# Patient Record
Sex: Female | Born: 1935 | Race: White | Hispanic: No
Health system: Southern US, Community
[De-identification: ages and names within clinical notes are randomized; demographics above are authoritative.]

---

## 2003-07-21 ENCOUNTER — Other Ambulatory Visit: Payer: Self-pay

## 2004-05-08 ENCOUNTER — Encounter: Payer: Self-pay | Admitting: Internal Medicine

## 2004-06-08 ENCOUNTER — Encounter: Payer: Self-pay | Admitting: Internal Medicine

## 2004-07-08 ENCOUNTER — Encounter: Payer: Self-pay | Admitting: Internal Medicine

## 2004-08-08 ENCOUNTER — Encounter: Payer: Self-pay | Admitting: Internal Medicine

## 2004-09-08 ENCOUNTER — Encounter: Payer: Self-pay | Admitting: Internal Medicine

## 2004-10-06 ENCOUNTER — Encounter: Payer: Self-pay | Admitting: Internal Medicine

## 2004-11-06 ENCOUNTER — Encounter: Payer: Self-pay | Admitting: Internal Medicine

## 2004-12-06 ENCOUNTER — Encounter: Payer: Self-pay | Admitting: Internal Medicine

## 2005-01-06 ENCOUNTER — Encounter: Payer: Self-pay | Admitting: Internal Medicine

## 2005-02-05 ENCOUNTER — Encounter: Payer: Self-pay | Admitting: Internal Medicine

## 2005-03-04 ENCOUNTER — Ambulatory Visit: Payer: Self-pay | Admitting: Internal Medicine

## 2005-03-08 ENCOUNTER — Encounter: Payer: Self-pay | Admitting: Internal Medicine

## 2005-04-08 ENCOUNTER — Encounter: Payer: Self-pay | Admitting: Internal Medicine

## 2005-05-08 ENCOUNTER — Encounter: Payer: Self-pay | Admitting: Internal Medicine

## 2005-06-08 ENCOUNTER — Encounter: Payer: Self-pay | Admitting: Internal Medicine

## 2005-07-08 ENCOUNTER — Encounter: Payer: Self-pay | Admitting: Internal Medicine

## 2005-07-13 ENCOUNTER — Ambulatory Visit: Payer: Self-pay | Admitting: Internal Medicine

## 2005-08-08 ENCOUNTER — Encounter: Payer: Self-pay | Admitting: Internal Medicine

## 2005-09-08 ENCOUNTER — Encounter: Payer: Self-pay | Admitting: Internal Medicine

## 2005-10-06 ENCOUNTER — Encounter: Payer: Self-pay | Admitting: Internal Medicine

## 2005-11-06 ENCOUNTER — Encounter: Payer: Self-pay | Admitting: Internal Medicine

## 2005-12-06 ENCOUNTER — Encounter: Payer: Self-pay | Admitting: Internal Medicine

## 2006-01-06 ENCOUNTER — Encounter: Payer: Self-pay | Admitting: Internal Medicine

## 2006-02-05 ENCOUNTER — Encounter: Payer: Self-pay | Admitting: Internal Medicine

## 2006-03-08 ENCOUNTER — Encounter: Payer: Self-pay | Admitting: Internal Medicine

## 2006-04-04 ENCOUNTER — Ambulatory Visit: Payer: Self-pay | Admitting: Internal Medicine

## 2006-04-06 ENCOUNTER — Inpatient Hospital Stay (HOSPITAL_COMMUNITY): Admission: EM | Admit: 2006-04-06 | Discharge: 2006-04-11 | Payer: Self-pay | Admitting: Emergency Medicine

## 2006-04-07 ENCOUNTER — Encounter: Payer: Self-pay | Admitting: Cardiology

## 2006-04-07 ENCOUNTER — Ambulatory Visit: Payer: Self-pay | Admitting: Cardiology

## 2006-04-08 ENCOUNTER — Encounter: Payer: Self-pay | Admitting: Internal Medicine

## 2006-05-08 ENCOUNTER — Encounter: Payer: Self-pay | Admitting: Internal Medicine

## 2006-06-08 ENCOUNTER — Encounter: Payer: Self-pay | Admitting: Internal Medicine

## 2006-07-08 ENCOUNTER — Encounter: Payer: Self-pay | Admitting: Internal Medicine

## 2006-08-08 ENCOUNTER — Encounter: Payer: Self-pay | Admitting: Internal Medicine

## 2006-09-08 ENCOUNTER — Encounter: Payer: Self-pay | Admitting: Internal Medicine

## 2006-10-07 ENCOUNTER — Encounter: Payer: Self-pay | Admitting: Internal Medicine

## 2006-10-22 ENCOUNTER — Inpatient Hospital Stay (HOSPITAL_COMMUNITY): Admission: EM | Admit: 2006-10-22 | Discharge: 2006-10-25 | Payer: Self-pay | Admitting: Emergency Medicine

## 2006-10-23 ENCOUNTER — Encounter: Payer: Self-pay | Admitting: Vascular Surgery

## 2006-10-23 ENCOUNTER — Ambulatory Visit: Payer: Self-pay | Admitting: Vascular Surgery

## 2006-10-23 ENCOUNTER — Encounter (INDEPENDENT_AMBULATORY_CARE_PROVIDER_SITE_OTHER): Payer: Self-pay | Admitting: Interventional Cardiology

## 2006-11-07 ENCOUNTER — Encounter: Payer: Self-pay | Admitting: Internal Medicine

## 2006-12-07 ENCOUNTER — Encounter: Payer: Self-pay | Admitting: Internal Medicine

## 2006-12-21 ENCOUNTER — Ambulatory Visit: Payer: Self-pay | Admitting: Specialist

## 2006-12-29 ENCOUNTER — Ambulatory Visit: Payer: Self-pay | Admitting: Specialist

## 2007-01-07 ENCOUNTER — Encounter: Payer: Self-pay | Admitting: Internal Medicine

## 2007-02-06 ENCOUNTER — Encounter: Payer: Self-pay | Admitting: Internal Medicine

## 2007-03-09 ENCOUNTER — Encounter: Payer: Self-pay | Admitting: Internal Medicine

## 2007-04-06 ENCOUNTER — Ambulatory Visit: Payer: Self-pay | Admitting: Internal Medicine

## 2007-04-09 ENCOUNTER — Encounter: Payer: Self-pay | Admitting: Internal Medicine

## 2007-05-09 ENCOUNTER — Encounter: Payer: Self-pay | Admitting: Internal Medicine

## 2007-06-09 ENCOUNTER — Encounter: Payer: Self-pay | Admitting: Internal Medicine

## 2007-07-09 ENCOUNTER — Encounter: Payer: Self-pay | Admitting: Internal Medicine

## 2007-08-09 ENCOUNTER — Encounter: Payer: Self-pay | Admitting: Internal Medicine

## 2007-09-09 ENCOUNTER — Encounter: Payer: Self-pay | Admitting: Internal Medicine

## 2007-10-07 ENCOUNTER — Encounter: Payer: Self-pay | Admitting: Internal Medicine

## 2007-11-07 ENCOUNTER — Encounter: Payer: Self-pay | Admitting: Internal Medicine

## 2007-11-20 ENCOUNTER — Emergency Department (HOSPITAL_COMMUNITY): Admission: EM | Admit: 2007-11-20 | Discharge: 2007-11-20 | Payer: Self-pay | Admitting: Emergency Medicine

## 2007-12-07 ENCOUNTER — Encounter: Payer: Self-pay | Admitting: Internal Medicine

## 2008-01-07 ENCOUNTER — Encounter: Payer: Self-pay | Admitting: Internal Medicine

## 2008-02-06 ENCOUNTER — Encounter: Payer: Self-pay | Admitting: Internal Medicine

## 2008-03-08 ENCOUNTER — Encounter: Payer: Self-pay | Admitting: Internal Medicine

## 2008-04-08 ENCOUNTER — Encounter: Payer: Self-pay | Admitting: Internal Medicine

## 2008-05-08 ENCOUNTER — Encounter: Payer: Self-pay | Admitting: Internal Medicine

## 2008-06-08 ENCOUNTER — Encounter: Payer: Self-pay | Admitting: Internal Medicine

## 2008-07-08 ENCOUNTER — Encounter: Payer: Self-pay | Admitting: Internal Medicine

## 2008-08-08 ENCOUNTER — Encounter: Payer: Self-pay | Admitting: Internal Medicine

## 2008-09-08 ENCOUNTER — Encounter: Payer: Self-pay | Admitting: Internal Medicine

## 2008-10-06 ENCOUNTER — Encounter: Payer: Self-pay | Admitting: Internal Medicine

## 2008-11-06 ENCOUNTER — Encounter: Payer: Self-pay | Admitting: Internal Medicine

## 2008-12-06 ENCOUNTER — Encounter: Payer: Self-pay | Admitting: Internal Medicine

## 2009-01-06 ENCOUNTER — Encounter: Payer: Self-pay | Admitting: Internal Medicine

## 2009-02-05 ENCOUNTER — Encounter: Payer: Self-pay | Admitting: Internal Medicine

## 2009-02-22 ENCOUNTER — Emergency Department: Payer: Self-pay | Admitting: Emergency Medicine

## 2009-03-08 ENCOUNTER — Encounter: Payer: Self-pay | Admitting: Internal Medicine

## 2009-04-08 ENCOUNTER — Encounter: Payer: Self-pay | Admitting: Internal Medicine

## 2009-05-08 ENCOUNTER — Encounter: Payer: Self-pay | Admitting: Internal Medicine

## 2009-06-08 ENCOUNTER — Encounter: Payer: Self-pay | Admitting: Internal Medicine

## 2009-07-08 ENCOUNTER — Encounter: Payer: Self-pay | Admitting: Internal Medicine

## 2009-08-08 ENCOUNTER — Encounter: Payer: Self-pay | Admitting: Internal Medicine

## 2009-09-08 ENCOUNTER — Encounter: Payer: Self-pay | Admitting: Internal Medicine

## 2009-10-06 ENCOUNTER — Encounter: Payer: Self-pay | Admitting: Internal Medicine

## 2009-11-06 ENCOUNTER — Encounter: Payer: Self-pay | Admitting: Internal Medicine

## 2009-12-06 ENCOUNTER — Encounter: Payer: Self-pay | Admitting: Internal Medicine

## 2010-01-06 ENCOUNTER — Encounter: Payer: Self-pay | Admitting: Internal Medicine

## 2010-02-05 ENCOUNTER — Encounter: Payer: Self-pay | Admitting: Internal Medicine

## 2010-03-08 ENCOUNTER — Encounter: Payer: Self-pay | Admitting: Internal Medicine

## 2010-04-08 ENCOUNTER — Encounter: Payer: Self-pay | Admitting: Internal Medicine

## 2010-05-08 ENCOUNTER — Encounter: Payer: Self-pay | Admitting: Internal Medicine

## 2010-06-08 ENCOUNTER — Encounter: Payer: Self-pay | Admitting: Internal Medicine

## 2010-06-12 ENCOUNTER — Emergency Department: Payer: Self-pay | Admitting: Emergency Medicine

## 2010-07-08 ENCOUNTER — Encounter: Payer: Self-pay | Admitting: Internal Medicine

## 2010-08-08 ENCOUNTER — Encounter: Payer: Self-pay | Admitting: Internal Medicine

## 2010-09-08 ENCOUNTER — Encounter: Payer: Self-pay | Admitting: Internal Medicine

## 2010-09-28 ENCOUNTER — Ambulatory Visit: Payer: Self-pay | Admitting: Internal Medicine

## 2010-09-29 ENCOUNTER — Inpatient Hospital Stay: Payer: Self-pay | Admitting: Internal Medicine

## 2010-09-30 DIAGNOSIS — I059 Rheumatic mitral valve disease, unspecified: Secondary | ICD-10-CM

## 2010-10-06 ENCOUNTER — Inpatient Hospital Stay: Payer: Self-pay | Admitting: Internal Medicine

## 2010-10-13 ENCOUNTER — Encounter: Payer: Self-pay | Admitting: Internal Medicine

## 2010-10-19 ENCOUNTER — Ambulatory Visit: Payer: Self-pay | Admitting: Internal Medicine

## 2010-10-19 ENCOUNTER — Inpatient Hospital Stay: Payer: Self-pay | Admitting: Internal Medicine

## 2010-11-07 ENCOUNTER — Encounter: Payer: Self-pay | Admitting: Internal Medicine

## 2010-12-09 ENCOUNTER — Emergency Department: Payer: Self-pay | Admitting: Emergency Medicine

## 2010-12-11 ENCOUNTER — Encounter: Payer: Self-pay | Admitting: Internal Medicine

## 2010-12-24 NOTE — Op Note (Signed)
NAMEADRIONA, KANEY NO.:  [id number]   MEDICAL RECORD NO.:  [id number]          PATIENT TYPE:  INP   LOCATION:  5033                         FACILITY:  [facility]   PHYSICIAN:  Nadara Mustard, MD     DATE OF BIRTH:  08-Mar-1916   DATE OF PROCEDURE:  04/07/2006  DATE OF DISCHARGE:                                 OPERATIVE REPORT   PREOPERATIVE DIAGNOSIS:  Left femoral neck fracture.   POSTOPERATIVE DIAGNOSIS:  Left femoral neck fracture.   PROCEDURE:  Left hip hemiarthroplasty with [place] components, +0 neck,  plus size 3 femur, and the 44-mm head, monopolar.   SURGEON:  Dr. Lajoyce Corners.   ANESTHESIA:  General.   ESTIMATED BLOOD LOSS:  Minimal.   ANTIBIOTICS:  1 gram Kefzol.   DRAINS:  None.   COMPLICATIONS:  None.   DISPOSITION:  To PACU in stable condition.   INDICATIONS FOR PROCEDURE:  The patient is an 77 year old woman who fell,  sustaining a femoral neck fracture, displaced, left hip.  The patient was  evaluated by cardiology.  She had a CT scan of her head.  She was felt to be  stable for surgical intervention and presents at this time for left hip  hemiarthroplasty.  Risks and benefits were discussed with the patient and  her daughter, who is the power of attorney, with risks including infection,  neurovascular injury, persistent pain, dislocation, DVT, pulmonary embolus,  need for additional surgery.  The patient's daughter, who is power of  attorney, states she understands and wished to proceed at this time.   DESCRIPTION OF PROCEDURE:  The patient was brought to OR #4 and underwent  general anesthetic.  After adequate level of anesthesia obtained, the  patient was placed in the right lateral decubitus position with the left  side up, and the left lower extremity was prepped using DuraPrep, draped  into a sterile field, and Ioban was used to cover all exposed skin.  A  posterolateral incision was made.  This was carried down to the tensor  fascia lata.  A Charnley retractor was placed.  The piriformis, short  external rotators were tagged, cut and retracted.  The capsule was incised  longitudinally along the femoral neck, and this was also retracted with a #1  Ethilon suture.  The femoral neck was exposed and the cut was made 1 cm  proximal to the calcar.  The head was delivered and this measured 44 mm.  The head was sequentially tried with different sizes, and the 44 had a good  suction fit.  The acetabulum was irrigated with normal saline.  The femoral  canal was then sequentially broached up to a size 3.  The wounds were  irrigated.  The size 3 stem was chosen, the +0 neck and the 44-mm head.  This was reduced.  The patient had flexion to 120 degrees, full abduction  and internal rotation of 70 degrees and this was stable.  She had full  extension and external rotation which was also stable.  The wound was again  irrigated with normal saline.  The capsule was reapproximated with #1  Ethilon, the piriformis and short external rotators were reapproximated with  #1 Ethilon, the tensor fascia lata was closed using #1 Vicryl, subcu was  closed using 2-0 Vicryl, the skin was closed using approximated staples.  The wound was covered with Adaptic, orthopedic sponges, ABD dressing and  Hypafix tape.  The patient was placed in the knee immobilizer, extubated and  taken to PACU in stable condition.  Plan for discharge to skilled nursing  once stable.      Nadara Mustard, MD  Electronically Signed     MVD/MEDQ  D:  04/07/2006  T:  04/09/2006  Job:  [id number]

## 2010-12-24 NOTE — Discharge Summary (Signed)
[name], [name]               ACCOUNT NO.:  [id number]   MEDICAL RECORD NO.:  [id number]          PATIENT TYPE:  INP   LOCATION:  5033                         FACILITY:  [facility]   PHYSICIAN:  Nadara Mustard, MD     DATE OF BIRTH:  [birth year]   DATE OF ADMISSION:  04/06/2006  DATE OF DISCHARGE:  04/11/2006                           DISCHARGE SUMMARY - REFERRING   DIAGNOSIS:  Left hip femoral neck fracture.   PROCEDURE:  Left hip hemiarthroplasty.   Discharged to skilled nursing in stable condition, physical therapy,  progressive ambulation, weight bearing as tolerated on the left with total  hip precautions, Coumadin 1 mg p.o. q day for four weeks for DVT  prophylaxis.  Resume patient's preadmission medications as per the medical  reconciliation form, pain medication Darvocet N100 one to two p.o. q 6 hours  p.r.n. for pain.  Harvest the staples in two weeks after discharge from  hospital.  Plan to followup with Dr. Lajoyce Corners two weeks after discharge.   HISTORY OF PRESENT ILLNESS:  Patient is an 77 year old woman, previously  independent living at home, who fell sustaining a left femoral neck  fracture.  Patient was brought to the hospital.  Medical consultation was  obtained as well as cardiology consultation and patient was felt to be  stable for surgical intervention.  She underwent hemiarthroplasty of the  left hip on 04/07/2006.  Postoperatively, she was placed on Kefzol for  infection prophylaxis and Coumadin for DVT prophylaxis.  Patient was unable  to progress well with therapy and she was set up for skilled nursing for  discharge.  Patient was stable.  She was felt to be stable from a cardiac  standpoint as well as from a general medical standpoint and both services  signed off on her care.  Patient was discharged to skilled nursing in stable  condition on 04/11/2006 with followup with Dr. Lajoyce Corners in two weeks.      Nadara Mustard, MD  Electronically Signed     MVD/MEDQ  D:   04/11/2006  T:  04/11/2006  Job:  [id number]

## 2010-12-24 NOTE — Discharge Summary (Signed)
NAMEAMANDAJO, Bethany [name]               ACCOUNT NO.:  [id number]   MEDICAL RECORD NO.:  [id number]          PATIENT TYPE:  INP   LOCATION:  3729                         FACILITY:  [facility]   PHYSICIAN:  Gustavus Messing. Orlin Hilding, M.D.DATE OF BIRTH:  1915/11/25   DATE OF ADMISSION:  10/22/2006  DATE OF DISCHARGE:  10/24/2006                               DISCHARGE SUMMARY   DIAGNOSES AT TIME OF DISCHARGE:  1. Left brain transient ischemic attack.  2. Baseline dementia, vascular and Alzheimer's type.  3. Dyslipidemia.  4. Hypertension.  5. History of right cerebral strokes.  6. Vitamin B12 deficiency.  7. Osteoporosis.  8. Degenerative disc disease.  9. Peripheral neuropathy.  10.Depression.  11.Hypothyroidism.  12.Gastroesophageal reflux disease.  13.Fibrocystic breast disease.  14.Polymyalgia rheumatica.  15.Bradycardia.  16.Syncope.   MEDICATIONS AT TIME OF DISCHARGE:  1. Diovan 80 mg a day.  2. Neurontin 300 mg at bedtime.  3. Hydrochlorothiazide 12.5 mg a day.  4. Synthroid 50 mcg a day.  5. Paxil 20 mg a day.  6. Protonix 40 mg a day.  7. Namenda 10 mg b.i.d.  8. Aggrenox 1 at bedtime x2 weeks, then increase to b.i.d.  9. Tylenol 650 mg tablet 1 hour prior to Aggrenox dose x1 week, then      discontinue.  10.Aspirin 81 mg daily x2 weeks, then discontinue.  11.Razadyne 4 mg b.i.d.  12.Multivitamin 1 a day.  13.Calcium carbonate with vitamin D b.i.d.  14.Vitamin B12 injection q.month.   STUDIES PERFORMED:  1. CT of the brain on admission shows old occipital infarcts.  No      acute abnormalities.  2. MRI of the brain shows no acute abnormality.  Advanced chronic      small vessel disease.  Sequelae of posterior circulation infarcts,      bilateral occipital and bilateral PICA territory.  3. MRA of the head shows motion-degraded study, with antegrade flow in      the carotid and vertebrobasilar system.  Mild irregularity of      these, compatible with atherosclerotic  disease.  More severe      irregularity is noted in the proximal left PCA.  4. Chest x-ray shows COPD and bronchitic changes, with questioning      enlarging nodule, left lower chest.  CT followup recommended.  5. Carotid Doppler shows no ICA stenosis.  6. 2D echocardiogram shows EF of 60-65%, with no embolic source.  7. EKG shows normal sinus rhythm.   LABORATORY STUDIES:  CBC with hemoglobin 12.1, hematocrit 35.7,  otherwise normal.  Chemistry with glucose 104, otherwise normal.  Coagulation studies normal.  Liver function tests normal, albumin 3.2.  Cardiac enzymes negative.  Cholesterol 200, triglycerides 125, HDL 40,  and LDL 135.  Urinalysis is negative.  Alcohol level less than 5.  Homocysteine 11.4.  Hemoglobin A1c 6.3.   HISTORY OF PRESENT ILLNESS:  Ms. Arseneau is an 77 year old, right-handed  Caucasian female who is a resident at [facility] in [place],  who was admitted for transient right-sided hemiparesis.  Ms. Speece has  multiple medical problems, including dementia, with vascular dementia  and Alzheimer's disease, as well as cardiac arrhythmias with syncope,  hypertension, B12 deficiency, Raynaud's phenomenon, hypothyroidism, and  degenerative disc disease and lower back pain.  She has history of  radiographically documented left thalamic and bilateral cerebellar  strokes in the past.  She has been on aspirin.  She was in her usual  state of health the day of admission until about 8:30 to 9 a.m., when  she developed weakness and slumping to the right at the nursing home.  Her symptoms cleared after arriving to [facility] Emergency Room.  A CT  scan of the brain showed old cerebellar and thalamic strokes.  MRI  showed old strokes but no acute abnormalities.  Because of her history  of TIAs, she was admitted for further evaluation.  She does have a  history of arrhythmias and has been evaluated by Dr. Graciela Husbands, who felt the  arrhythmias were secondary to the use of  Aricept.  She has had a history  of multiple falls in the past related to this.  She was admitted to the  hospital for stroke workup.  She was not a tPA candidate secondary to  quick resolution of symptoms.   HOSPITAL COURSE:  MRI was negative, as per above.  It was felt she had a  left brain TIA with no residual deficits.  Her infarcts in the past have  been felt to be secondary to small vessel disease.  She was changed to  Aggrenox for secondary stroke prevention.  No other new stroke findings  or risk factors were identified.  Of note, she does have a chest x-ray  with enlarging left lower lobe nodule of unknown etiology.  Will do CT  of the chest prior to discharge back to facility.   CONDITION AT DISCHARGE:  Patient alert, oriented to age, place, and  month.  She is able to follow commands.  She has full extraocular  movements and full visual fields.  She has no facial droop.  She has no  drift in her upper extremities and no drift in her lower extremities.  She has no ataxia and normal sensation.  She has no aphasia, dysarthria,  or extinction.   DISCHARGE PLAN:  1. Discharge back to [facility] in [place].  2. Aggrenox for secondary stroke prevention.  3. Primary M.D. or nursing home M.D. to follow up CT of the chest.  4. Follow up with Dr. Sandria Manly as per regular appointment made with him.   Addendum: The chest CT also revealed nodule suspicious for neoplasm.  This has been seen on several CXR dating back to last summer. The  patient has dementia and is 77 years old. Her daughter was made aware of  the findings and the recommendation to follow up with the patient's  primary care doctor, Einar Crow.      Annie Main, N.P.      Catherine A. Orlin Hilding, M.D.  Electronically Signed    SB/MEDQ  D:  10/24/2006  T:  10/24/2006  Job:  [id number]   cc:   Genene Churn. Love, M.D.  Dr. Dareen Piano

## 2010-12-24 NOTE — Consult Note (Signed)
Bethany [name], Bethany [name] NO.:  [id number]   MEDICAL RECORD NO.:  [id number]          PATIENT TYPE:  INP   LOCATION:  5033                         FACILITY:  [facility]   PHYSICIAN:  Gerrit Friends. Dietrich Pates, MD, FACCDATE OF BIRTH:  June 18, 1916   DATE OF CONSULTATION:  04/07/2006  DATE OF DISCHARGE:                                   CONSULTATION   REFERRING PHYSICIAN:  Dr. Lajoyce Corners.   PRIMARY CARE PHYSICIAN:  Dr. Aletha Halim in [place].   PRIMARY CARDIOLOGIST:  Dr. Graciela Husbands.   HISTORY OF PRESENT ILLNESS:  An 77 year old woman referred for cardiology  assessment due to abnormal cardiac markers.  Bethany [name] was previously  evaluated by Dr. Graciela Husbands in 2005 and 2006 for brady arrhythmias and possible  syncope.  She has experienced a number of falls, but the mechanism was not  clear.  Monitoring had demonstrated heart rates into the 30s at night with  pauses up to 3 seconds.  With change in her medication, her brady arrhythmia  resolved.  She returned 7 months later with recurrent bradycardia on small  doses of beta blocker, which were discontinued.  She has not been seen since  2006.  A stress nuclear study was performed in 2004 that was negative.  She  is now admitted with left hip fracture, again having been found on the floor  of her nursing home.  The patient has significant dementia and does not  provide reliable history.  It is unclear whether she lost consciousness.   She has a history of hypertension.  Cholesterol status is unknown.  She has  no known vascular disease.   PAST MEDICAL HISTORY:  1. Dementia.  2. Hypothyroidism for which she is receiving replacement therapy and now      has a normal TSH.  3. Pernicious anemia.  4. Osteoporosis.  5. Raynaud's syndrome.  6. Peripheral neuropathy.  7. GERD.  8. Depression.  9. Fibrocystic disease of the breast.  10.DJD of the cervical spine, which required prior surgery.   MEDICATIONS PRIOR TO ADMISSION:  1. Prilosec 20 mg  q.d.  2. Levothyroxine 0.05 mg q.d.  3. Paxil 20 mg q.d.  4. Razadyne ER 24 mg q.d.  5. Aspirin 81 mg q.d.  6. Diovan 80 mg q.d.  7. Boniva 75 mg every month.   ALLERGIES:  SHE HAS NO KNOWN ALLERGIES BUT SHOULD NOT BE TREATED WITH  MEDICATIONS THAT WOULD TEND TO REDUCE HEART RATE OR IMPAIR CARDIAC  CONDUCTION.   SOCIAL HISTORY:  Lives in a skilled care facility in [place]; two adult  children who live in the area; no history of tobacco products or alcohol.   FAMILY HISTORY:  Is unobtainable.   REVIEW OF SYSTEMS:  Is unobtainable.   PHYSICAL EXAMINATION:  GENERAL:  On exam, pleasant woman lying immobile in  bed in no acute distress.  VITAL SIGNS:  The temperature is 98.5, heart rate 80 and regular,  respirations 20, blood pressure 120/60, O2 saturation 94% on 2 liters.  HEENT:  Pupils equal, round, and reactive to light; EOMs full; normal lids  and conjunctivae.  NECK:  JVD present with the patient essentially supine; normal carotid  upstrokes without bruits.  LUNGS:  Clear.  CARDIAC:  Normal first and second heart sounds; fourth heart sound present.  Normal PMI.  ABDOMEN:  Soft and nontender; no organomegaly; normal bowel sounds without  bruits.  EXTREMITIES:  Left leg rotated and shortened; marked pain with minimal  movement; no edema on the right.  NEUROMUSCULAR:  Oriented x1; no focal findings.   LABORATORY DATA:  Chest x-ray shows no acute changes; there are findings of  chronic bronchitis and a left lung nodule.  EKG:  Normal sinus rhythm;  normal intervals; nonspecific ST-T-wave abnormality.   Other laboratory notable for normal electrolytes, normal renal function,  normal CBC and normal TSH.  CPK is mildly elevated with normal MB fraction,  and troponin is 0.14 and 0.30.   IMPRESSION:  Bethany [name] presents with a recurrent fall and hip fracture.  There is nothing in her history to suggest an acute ischemic syndrome,  although history is unreliable.  Likewise,  she has no acute EKG  abnormalities.  In general, when laboratory tests are obtained without good  indication, a  positive result is more likely a false than true positive.  Due to her advanced age, DNR status and multiple medical issues, definitive  evaluation is difficult.  Since she also is febrile at the present time, I  would not recommend proceeding immediately with surgical repair of her hip  fracture.  An echocardiogram will be obtained to evaluate regional and  global left ventricular systolic function.  A D-dimer and D-dimer level will  also be obtained.  If abnormal, we will need to pursue diagnosis of possible  pulmonary embolism.  Even without abnormal troponin, she already has a  substantially elevated surgical risk.  Since she will not walk again without  surgical intervention, it is likely that her family will wish to tolerate  that risk in order for her to obtain the benefits of surgical repair of her  fracture.  Due to conduction system disease, she is not a candidate to  receive beta blocker for possible reduction and perioperative morbidity and  mortality.  We greatly appreciate the request for consultation and will be  happy to follow this woman with you.      Gerrit Friends. Dietrich Pates, MD, [facility]  Electronically Signed     RMR/MEDQ  D:  04/07/2006  T:  04/07/2006  Job:  [id number]

## 2010-12-24 NOTE — Consult Note (Signed)
NAMEELANTRA, Bethany [name] NO.:  [id number]   MEDICAL RECORD NO.:  [id number]          PATIENT TYPE:  INP   LOCATION:  5033                         FACILITY:  [facility]   PHYSICIAN:  Elliot Cousin, M.D.    DATE OF BIRTH:  09-02-1915   DATE OF CONSULTATION:  04/06/2006  DATE OF DISCHARGE:                                   CONSULTATION   REFERRING PHYSICIAN:  Nadara Mustard, MD.   PRIMARY CARE PHYSICIAN:  Dr. Dareen Piano, [place], [place].  Neurologist, Dr. Avie Echevaria.  Cardiologist, Dr. Sherryl Manges.   REASON FOR CONSULTATION:  Medical preoperative clearance and management of  hypertension, neuropathy and Alzheimer's dementia.   HISTORY OF PRESENT ILLNESS:  The patient is an 77 year old lady with a past  medical history significant for syncope associated with bradycardia, TIA,  remote stroke, peripheral neuropathy and Alzheimer's dementia, who presented  to the emergency department this morning after falling at the skilled  nursing facility in [place], [place].  According to the patient,  the patient's daughter and the nursing home staff, the patient was found on  the bedroom floor in her room this morning.  She says that she was trying to  get up to go to the bathroom.  She bent over and became dizzy and fell on  the floor.  She fell on her left side and complained of left hip pain  immediately.  She was unable to get up.  She knocked on the door until  someone came to check on her.  The patient says that she became dizzy, but  did not completely lose consciousness.  There appeared to have no head  trauma.  She did complain of left hip pain and left leg pain.  She denies  chest pain, shortness of breath and palpitations.  She does have chronic  headaches and says that her headache is no worse than usual.  She has a  history of falls associated with possibly TIAs and dizziness.  The patient  also has a history of syncope associated with bradycardia.  Per  the  patient's daughter, Ms. Tamala Ser, the patient had been evaluated by  cardiologist, Dr. Graciela Husbands.  At that time, it was decided that a pacemaker was  not needed.  The patient is also being followed by a neurologist, Dr. Sandria Manly,  for treatment of Alzheimer's dementia and history of TIAs.  Of note, the  patient has had no history of myocardial infarction or congestive heart  failure.   During the evaluation in the emergency department, the patient was found to  have an acute subcapital left femur fracture.  Dr. Lajoyce Corners plans surgical  repair and requests that [facility] hospitalists evaluate her preoperatively.   PAST MEDICAL HISTORY:  1. History of syncope associated with bradycardia in the past.  The      patient has had no further syncopal episodes since 2005.  A pacemaker      had been contemplated in the past, however, one was not recommended.      The patient had been followed by cardiologist, Dr. Sherryl Manges.  2. History of a remote stroke and TIA by history.  3. Alzheimer's dementia followed by Dr. Avie Echevaria.  4. Peripheral neuropathy.  5. Hypertension.  6. Raynaud's syndrome.  7. Osteoporosis.  8. Vitamin B12 deficiency.  9. Hypothyroidism.  10.Gastroesophageal reflux disease.  11.Depression.  12.Goiter, status post partial thyroidectomy in February2005.  13.Degenerative joint disease, status post cervical spine surgery.  14.Fibrocystic breast disease.  15.Polymyalgia rheumatica.  16.History of ST and T wave abnormalities on EKG in April 2004.  17.History of moderate mitral valve regurgitation and tricuspid valve      regurgitation with preserved LV function per echocardiogram in      January2004.   MEDICATIONS:  1. Boniva 150 mg monthly.  2. Aspirin 81 mg daily.  3. Vitamin B12 injection 1000 mcg once monthly.  4. Diovan 80 mg daily.  5. Gabapentin 300 mg nightly.  6. Multivitamin once daily.  7. Nasacort AQ one spray in each nostril daily.  8. Os-Cal D 500 mg  b.i.d.  9. Paroxetine (Paxil) 20 mg daily.  10.Prilosec OTC 20 mg daily.  11.Razadyne ER 24 mg daily.  12.Synthroid 50 mcg daily.  13.Guaifenesin 10 mL every 4 hours as needed for cough.  14.Milk of Magnesia 30 mL nightly p.r.n.  15.Phenergan 25 mg every 4-6 hours p.r.n.  16.Tylenol 325 mg q.4h. p.r.n.   ALLERGIES:  No known drug allergies.   SOCIAL HISTORY:  The patient is widowed.  She lives at the [facility] at  [facility] in [place], [place].  She has been there  for 3 years.  She generally ambulates unassisted.  She has two children.  Her daughter, Coni Homesley, phone number [phone], is currently  providing part of the history.  The patient is a retired Geologist, engineering.  She  denies history of tobacco use.  She drinks a glass of wine only  occasionally.   FAMILY HISTORY:  Her father died of Alzheimer's in his 95s.  Her mother died  of congestive heart failure in her 30s.   REVIEW OF SYSTEMS:  The patient's review of systems is positive for  confusion,  occasional dizziness, decrease in visual acuity, occasional  numbness and tingling in her feet, occasional swelling in her ankles,  chronic headaches and degenerative joint pain.  Otherwise, review of systems  is negative.   PHYSICAL EXAMINATION:  VITAL SIGNS:  Temperature afebrile, pulse 75,  respiratory rate 16, oxygen saturation 91% on room air blood pressure  122/50.  GENERAL:  The patient is an elderly, 77 year old, Caucasian lady  who is currently lying in bed in no acute distress.  HEENT:  Head is normocephalic, nontraumatic.  Pupils equal, round and react  to light.  Extraocular muscles are intact.  Conjunctivae are clear.  The  left sclerae has a circumscribed, erythematous lesion medially.  This  appears to be benign.  The right sclerae without any abnormalities.  The nasal mucosa mildly dry.  No sinus tenderness.  Oropharynx reveals fair  dentition.  Mucous membranes are mildly dry.  No  posterior exudates or  erythema.  NECK:  Neck is supple.  No adenopathy, no thyromegaly, no bruit or JVD.  LUNGS:  Decreased breath sounds in the bases, otherwise clear.  HEART:  S1, S2 with a soft systolic murmur.  ABDOMEN:  Positive bowel sounds, soft, nontender, nondistended.  No  hepatosplenomegaly.  No masses palpated.  EXTREMITIES:  The patient has mild  to moderate tenderness and mild edema over the left hip and left pelvis.  No  pain or tenderness over the right hip or pelvis.  The patient is able to  lift each leg against gravity approximately 10 degrees with some discomfort.  Pedal pulses barely palpable.  No pretibial edema and no pedal edema.  NEUROLOGIC:  The patient is alert and oriented to herself, her daughter and  the place.  She is not oriented to year or month.  Her speech is clear.  Cranial nerves II-XII appear to be grossly intact.  She follows directions  well.  Upper extremity strength is 5/5.  Bilateral lower extremity strength  is 4+/5 secondary to discomfort.  Sensation is grossly intact.   LABORATORY DATA AND X-RAY FINDINGS:  On admission, EKG reveals normal sinus  rhythm with nonspecific ST and T wave abnormalities with a heart rate of 79  beats per minute.  Chest x-ray reveals cardiomegaly, diffuse peribronchial  thickening, nodular opacity over the left lower lung, possibly breast  nipple.  X-ray of the left hip, results as stated above.   Urinalysis negative.  Sodium 139, potassium 3.7, chloride 106, CO2 27, BUN  24, creatinine 1.2, glucose 106.  PTT 29.  PT 12.1, WBC 14.1, hemoglobin  13.7, platelets 248.   ASSESSMENT:  1. I recommend proceeding with surgical repair of the left hip as the      patient would probably fail to thrive without the surgery. She is at      mild to moderate risk for perioperative complications, although she has      had no history of congestive heart failure or myocardial infarction.      The nonspecific ST and T wave  abnormality seen on EKG is actually not      impressive.  However, I would assess one set of cardiac enzymes to      assess for cardiac ischemia.  Of note, in review of the old records      from the assisted-living facility, the patient's EKG in 2004, also      revealed nonspecific ST and T wave abnormalities.  2. Status post fall.  The etiology of the patient's fall is unclear.  The      differential diagnoses include dizziness, loss of balance associated      with peripheral neuropathy, accidental tripping, or transient ischemic      attack.  3. Resultant acute left femur fracture.  Management per Dr. Lajoyce Corners.  4. Mild diffuse peribronchial thickening.  The patient's saturations      appear to be okay.  No wheezes or crackles are heard on lung exam.  5. History of syncope associated with bradycardia.  The patient's heart     rate is within normal limits now.  No recent episodes of syncope in 2      years.  6. Hypertension.  The patient is chronically treated with Diovan.  Her      blood pressure is within normal limits now.  7. Hypothyroidism.  The patient is chronically treated with Synthroid.  A      TSH will need to be assessed.  8. Alzheimer's dementia.  The patient is chronically treated with the      Razadyne.  9. Mild leukocytosis.  The patient has no fever.  The leukocytosis is      possibly reactive.   RECOMMENDATIONS:  1. As stated above, would proceed with surgical repair of the left hip.      Will check cardiac enzymes for additional assessment.  2. Will continue chronic cardiac medications  with the exception of Boniva      and aspirin.  3. Will check her TSH as well.  4. Prophylactic Protonix.  5. Lovenox and Coumadin postoperatively per Dr. Lajoyce Corners.   Thank you for the consultation.  Will follow along with you.      Elliot Cousin, M.D.  Electronically Signed     DF/MEDQ  D:  04/06/2006  T:  04/07/2006  Job:  [id number]   cc:   Duke Salvia, MD, [facility]  Genene Churn.  Love, M.D.

## 2010-12-24 NOTE — H&P (Signed)
NAMESUMAIYA, ARRUDA               ACCOUNT NO.:  [id number]   MEDICAL RECORD NO.:  [id number]          PATIENT TYPE:  INP   LOCATION:  3729                         FACILITY:  [facility]   PHYSICIAN:  Genene Churn. Love, M.D.    DATE OF BIRTH:  09-23-1915   DATE OF ADMISSION:  10/22/2006  DATE OF DISCHARGE:                              HISTORY & PHYSICAL   REASON FOR ADMISSION:  This is the second [facility] admission  for this 77 year old right-handed white female, a resident of [facility] in [place], [place], admitted for  evaluation of transient right-sided weakness.   HISTORY OF PRESENT ILLNESS:  Ms. Slinker has multiple medical problems  including dementia with vascular dementia and Alzheimer's disease,  cardiac arrhythmias with syncope in the past, hypertension, B-12  deficiency, Raynaud's phenomena, hypothyroidism, and degenerative disk  disease and lower back pain.  She had radiographic documented left  thalamic and bilateral cerebellar strokes in the past.  She has been on  aspirin.  She was in usual state of health today, and about 8:30 to 9:00  a.m. developed weakness and slumping to the right at the nursing home,  spilling her coffee, which cleared after arriving at [facility].  A CT scan of the brain showed old cerebellar and thalamic  strokes.  MRI of the brain showed old cerebellar and thalamic strokes,  and MRA showed intracranial vascular disease, but it was of poor  quality.  Because of her history of TIAs, she is admitted for further  evaluation.  She has a known history of cardiac disease.  She has a  history of abnormal cardiac markers when she was in the hospital in  August of 2007 and was seen [facility].  In 2005 and 2006, she  was seen by Dr. Berton Mount for bradyarrhythmias and possibly syncope,  thought to be related to the use of Aricept.  She has had a number of  falls in the past related to this.   PATHOLOGY:  1. Hypertension.  2. Bicerebral strokes.  3. Dementia, both vascular and Alzheimer's.  4. Vitamin B-12 deficiency.  5. Osteoporosis.  6. Degenerative disk disease.  7. Peripheral neuropathy.  8. Depression.  9. Hypothyroidism.  10.Gastroesophageal reflux disease.  11.Fibrocystic breast disease.  12.Polymyalgia rheumatica.  13.Bradycardia and syncope,.   MEDICATIONS:  1. Aspirin 81 mg per day.  2. Boniva 150 mg monthly.  3. Vitamin B-12 IM once per month.  4. Diovan 80 mg daily.  5. Gabapentin 300 mg q.h.s.  6. Hexavitamin one daily.  7. Hydrochlorothiazide 12.5 mg a daily.  8. Namenda 10 mg b.i.d.  9. Optical D vitamin once per day.  10.Paxil 20 mg per day.  11.Prilosec 20 mg over-the-counter once per day.  12.Razadyne 24 mg ER once per day.  13.Synthroid 0.05 mg daily.   SOCIAL HISTORY:  She went to the tenth grade in school.  She has 2  children, a daughter 80 and a son 16, living and well.   FAMILY HISTORY:  Her mother died at 89 from congestive heart failure.  Her father died in his 93s from a stroke.  She has 3 brothers, all of  whom have died.  She had 6 sisters, 3 of whom have died.  She has 3  living at 93, 85 and 81.   PHYSICAL EXAMINATION:  GENERAL:  A well-developed thin white female.  VITAL SIGNS:  Blood pressure in the right and left arm of 150/80, heart  rate was 84.  There were no bruits.  MENTAL STATUS:  She was alert and oriented to person only.  She followed  commands.  Her cranial nerve examination revealed visual fields were  full.  Both disks were flat.  Extraocular movements was full.  Corneals  were present.  There was no seventh nerve palsy.  Hearing was intact.  Air conduction was greater than bone conduction.  Tongue was midline.  The uvula was midline.  Gags were present.  Sternocleidomastoid and  trapezius testing normal.  Motor examination revealed 5/5 strength in  the upper extremities.  She had decreased vibration sense in the lower   extremities, absent ankle jerks, downgoing plantar responses.  HEENT:  Tympanic membranes were clear.  LUNGS:  Clear.  HEART:  No murmurs.  Bowel sounds were normal.  There was no enlargement  of the liver, spleen or kidneys.   LABORATORY DATA:  Chest x-ray showed chronic obstructive pulmonary  disease with bronchitic changes and questionable left lower lung nodule.  CT scan showed atrophy and old occipital infarcts.  MRI showed bilateral  PCA and pica strokes.  MRA was of poor quality.  EKG showed sinus rhythm  with PACs.   IMPRESSION:  1. Left brain transient ischemic attack, code 425.9.  2. History by cerebral strokes, code 433.31.  3. Hypertension, code 796.2.  4. Vitamin B-12 deficiency, code 281.1.  5. Dementia, both vascular and Alzheimer's, code 780.93 and 333.1.  6. Osteoporosis, code unknown.  7. Depression, code 311  8. Degenerative disk disease, code 724.4.  9. Peripheral neuropathy, code 356.9.  10.Hypothyroidism, code 344.9.  11.Gastroesophageal reflux disease, code 530.81.  12.Polymyalgia rheumatica.  13.Status post left hip surgery in August of 2007.   PLAN:  The plan at this time is to admit the patient for a 2-D  echocardiogram, place her on Aggrenox and obtain Doppler study of the  carotids.           ______________________________  Genene Churn. Sandria Manly, M.D.     JML/MEDQ  D:  10/22/2006  T:  10/23/2006  Job:  [id number]   cc:   [facility] [place] [facility]

## 2011-01-07 ENCOUNTER — Encounter: Payer: Self-pay | Admitting: Internal Medicine

## 2011-02-06 ENCOUNTER — Encounter: Payer: Self-pay | Admitting: Internal Medicine

## 2011-03-09 ENCOUNTER — Encounter: Payer: Self-pay | Admitting: Internal Medicine

## 2011-04-09 ENCOUNTER — Encounter: Payer: Self-pay | Admitting: Internal Medicine

## 2011-05-03 LAB — URINALYSIS, ROUTINE W REFLEX MICROSCOPIC
Glucose, UA: NEGATIVE
Hgb urine dipstick: NEGATIVE
Ketones, ur: NEGATIVE
Nitrite: NEGATIVE
Protein, ur: 30 — AB
Specific Gravity, Urine: 1.017
Urobilinogen, UA: 1
pH: 5

## 2011-05-03 LAB — URINE MICROSCOPIC-ADD ON

## 2011-05-09 ENCOUNTER — Encounter: Payer: Self-pay | Admitting: Internal Medicine

## 2011-06-09 ENCOUNTER — Encounter: Payer: Self-pay | Admitting: Internal Medicine

## 2011-07-09 ENCOUNTER — Encounter: Payer: Self-pay | Admitting: Internal Medicine

## 2011-08-09 ENCOUNTER — Encounter: Payer: Self-pay | Admitting: Internal Medicine

## 2011-09-06 LAB — BASIC METABOLIC PANEL
Anion Gap: 8 (ref 7–16)
BUN: 30 mg/dL — ABNORMAL HIGH (ref 7–18)
Chloride: 104 mmol/L (ref 98–107)
Creatinine: 1.24 mg/dL (ref 0.60–1.30)
EGFR (African American): 52 — ABNORMAL LOW
EGFR (Non-African Amer.): 43 — ABNORMAL LOW
Glucose: 123 mg/dL — ABNORMAL HIGH (ref 65–99)

## 2011-09-09 ENCOUNTER — Encounter: Payer: Self-pay | Admitting: Internal Medicine

## 2011-10-07 ENCOUNTER — Encounter: Payer: Self-pay | Admitting: Internal Medicine

## 2011-11-07 ENCOUNTER — Encounter: Payer: Self-pay | Admitting: Internal Medicine

## 2011-12-07 ENCOUNTER — Encounter: Payer: Self-pay | Admitting: Internal Medicine

## 2011-12-18 LAB — URINALYSIS, COMPLETE
Blood: NEGATIVE
Ph: 5 (ref 4.5–8.0)
Protein: NEGATIVE
Specific Gravity: 1.02 (ref 1.003–1.030)

## 2011-12-20 LAB — URINE CULTURE

## 2012-01-07 ENCOUNTER — Encounter: Payer: Self-pay | Admitting: Internal Medicine

## 2012-02-06 ENCOUNTER — Encounter: Payer: Self-pay | Admitting: Internal Medicine

## 2012-03-08 ENCOUNTER — Encounter: Payer: Self-pay | Admitting: Internal Medicine

## 2012-04-08 ENCOUNTER — Encounter: Payer: Self-pay | Admitting: Internal Medicine

## 2012-04-19 LAB — BASIC METABOLIC PANEL
Anion Gap: 8 (ref 7–16)
Chloride: 105 mmol/L (ref 98–107)
Co2: 27 mmol/L (ref 21–32)
Creatinine: 1.14 mg/dL (ref 0.60–1.30)
Sodium: 140 mmol/L (ref 136–145)

## 2012-04-19 LAB — LIPID PANEL
HDL Cholesterol: 42 mg/dL (ref 40–60)
VLDL Cholesterol, Calc: 50 mg/dL — ABNORMAL HIGH (ref 5–40)

## 2012-05-04 LAB — URINALYSIS, COMPLETE
Bilirubin,UR: NEGATIVE
Blood: NEGATIVE
Ketone: NEGATIVE
Ph: 6 (ref 4.5–8.0)
RBC,UR: 1 /HPF (ref 0–5)
Specific Gravity: 1.015 (ref 1.003–1.030)
Squamous Epithelial: 1

## 2012-05-08 ENCOUNTER — Encounter: Payer: Self-pay | Admitting: Internal Medicine

## 2012-06-08 ENCOUNTER — Encounter: Payer: Self-pay | Admitting: Internal Medicine

## 2012-07-08 ENCOUNTER — Encounter: Payer: Self-pay | Admitting: Internal Medicine

## 2012-08-08 ENCOUNTER — Encounter: Payer: Self-pay | Admitting: Internal Medicine

## 2012-09-08 ENCOUNTER — Encounter: Payer: Self-pay | Admitting: Internal Medicine

## 2012-10-06 ENCOUNTER — Encounter: Payer: Self-pay | Admitting: Internal Medicine

## 2012-11-07 ENCOUNTER — Emergency Department: Payer: Self-pay | Admitting: Internal Medicine

## 2012-11-07 LAB — URINALYSIS, COMPLETE
Bilirubin,UR: NEGATIVE
Glucose,UR: NEGATIVE mg/dL (ref 0–75)
Ph: 7 (ref 4.5–8.0)
Protein: NEGATIVE
Specific Gravity: 1.016 (ref 1.003–1.030)
Squamous Epithelial: NONE SEEN
WBC UR: 6 /HPF (ref 0–5)

## 2012-11-07 LAB — COMPREHENSIVE METABOLIC PANEL
Albumin: 3.6 g/dL (ref 3.4–5.0)
Alkaline Phosphatase: 120 U/L (ref 50–136)
Bilirubin,Total: 0.8 mg/dL (ref 0.2–1.0)
Calcium, Total: 9.1 mg/dL (ref 8.5–10.1)
Chloride: 106 mmol/L (ref 98–107)
EGFR (African American): 55 — ABNORMAL LOW
Glucose: 158 mg/dL — ABNORMAL HIGH (ref 65–99)
Osmolality: 286 (ref 275–301)
Sodium: 140 mmol/L (ref 136–145)
Total Protein: 7.3 g/dL (ref 6.4–8.2)

## 2012-11-07 LAB — CBC
MCHC: 32.7 g/dL (ref 32.0–36.0)
MCV: 92 fL (ref 80–100)
Platelet: 206 10*3/uL (ref 150–440)
RBC: 4.18 10*6/uL (ref 3.80–5.20)

## 2012-11-07 LAB — CK TOTAL AND CKMB (NOT AT ARMC): CK, Total: 158 U/L (ref 21–215)

## 2012-11-07 LAB — TROPONIN I: Troponin-I: <0.02 ng/mL

## 2012-11-08 ENCOUNTER — Encounter: Payer: Self-pay | Admitting: Internal Medicine

## 2012-11-28 ENCOUNTER — Emergency Department: Payer: Self-pay

## 2012-12-06 ENCOUNTER — Ambulatory Visit: Payer: Self-pay | Admitting: Nurse Practitioner

## 2012-12-06 ENCOUNTER — Encounter: Payer: Self-pay | Admitting: Internal Medicine

## 2013-01-06 ENCOUNTER — Encounter: Payer: Self-pay | Admitting: Internal Medicine

## 2013-01-06 ENCOUNTER — Ambulatory Visit: Payer: Self-pay | Admitting: Nurse Practitioner

## 2013-01-08 LAB — URINALYSIS, COMPLETE
Bilirubin,UR: NEGATIVE
Glucose,UR: NEGATIVE mg/dL (ref 0–75)
Ketone: NEGATIVE
Nitrite: POSITIVE
Ph: 5 (ref 4.5–8.0)
Protein: NEGATIVE
RBC,UR: 6 /HPF (ref 0–5)
Specific Gravity: 1.012 (ref 1.003–1.030)
WBC UR: 663 /HPF (ref 0–5)

## 2013-01-22 LAB — URINALYSIS, COMPLETE
Glucose,UR: NEGATIVE mg/dL (ref 0–75)
Ketone: NEGATIVE
Nitrite: NEGATIVE
Protein: NEGATIVE
RBC,UR: <1 /HPF (ref 0–5)
Specific Gravity: 1.018 (ref 1.003–1.030)
Squamous Epithelial: <1
WBC UR: 1 /HPF (ref 0–5)

## 2013-02-05 ENCOUNTER — Encounter: Payer: Self-pay | Admitting: Internal Medicine

## 2013-02-05 ENCOUNTER — Ambulatory Visit: Payer: Self-pay | Admitting: Nurse Practitioner

## 2013-02-25 LAB — COMPREHENSIVE METABOLIC PANEL
Alkaline Phosphatase: 155 U/L — ABNORMAL HIGH (ref 50–136)
Bilirubin,Total: 0.2 mg/dL (ref 0.2–1.0)
Chloride: 104 mmol/L (ref 98–107)
Co2: 28 mmol/L (ref 21–32)
Creatinine: 1.33 mg/dL — ABNORMAL HIGH (ref 0.60–1.30)
EGFR (Non-African Amer.): 35 — ABNORMAL LOW
Glucose: 201 mg/dL — ABNORMAL HIGH (ref 65–99)
Osmolality: 286 (ref 275–301)
Potassium: 4 mmol/L (ref 3.5–5.1)
SGOT(AST): 23 U/L (ref 15–37)
SGPT (ALT): 18 U/L (ref 12–78)
Sodium: 138 mmol/L (ref 136–145)
Total Protein: 7.4 g/dL (ref 6.4–8.2)

## 2013-02-25 LAB — CBC WITH DIFFERENTIAL/PLATELET
HCT: 37.3 % (ref 35.0–47.0)
HGB: 12.3 g/dL (ref 12.0–16.0)
Lymphocyte #: 3 10*3/uL (ref 1.0–3.6)
MCHC: 32.9 g/dL (ref 32.0–36.0)
MCV: 94 fL (ref 80–100)
Monocyte #: 0.8 x10 3/mm (ref 0.2–0.9)
Neutrophil #: 3.3 10*3/uL (ref 1.4–6.5)
Neutrophil %: 45.6 %
Platelet: 303 10*3/uL (ref 150–440)
WBC: 7.1 10*3/uL (ref 3.6–11.0)

## 2013-02-25 LAB — URINALYSIS, COMPLETE
Bilirubin,UR: NEGATIVE
Blood: NEGATIVE
Glucose,UR: NEGATIVE mg/dL (ref 0–75)
Hyaline Cast: 5

## 2013-03-08 ENCOUNTER — Encounter: Payer: Self-pay | Admitting: Internal Medicine

## 2013-04-08 ENCOUNTER — Ambulatory Visit: Payer: Self-pay | Admitting: Nurse Practitioner

## 2013-04-08 ENCOUNTER — Encounter: Payer: Self-pay | Admitting: Internal Medicine

## 2013-05-08 ENCOUNTER — Encounter: Payer: Self-pay | Admitting: Internal Medicine

## 2013-05-08 ENCOUNTER — Ambulatory Visit: Payer: Self-pay | Admitting: Nurse Practitioner

## 2013-05-17 LAB — URINALYSIS, COMPLETE: Bilirubin,UR: NEGATIVE

## 2013-05-20 LAB — URINE CULTURE

## 2013-06-08 ENCOUNTER — Encounter: Payer: Self-pay | Admitting: Internal Medicine

## 2013-06-08 ENCOUNTER — Ambulatory Visit: Payer: Self-pay | Admitting: Nurse Practitioner

## 2013-06-10 IMAGING — CR DG CHEST 1V
1 series · 1 of 1 positions shown · non-contrast
Comparison: none

REASON FOR EXAM: pelvic fx
COMMENTS:

[t chest supine]
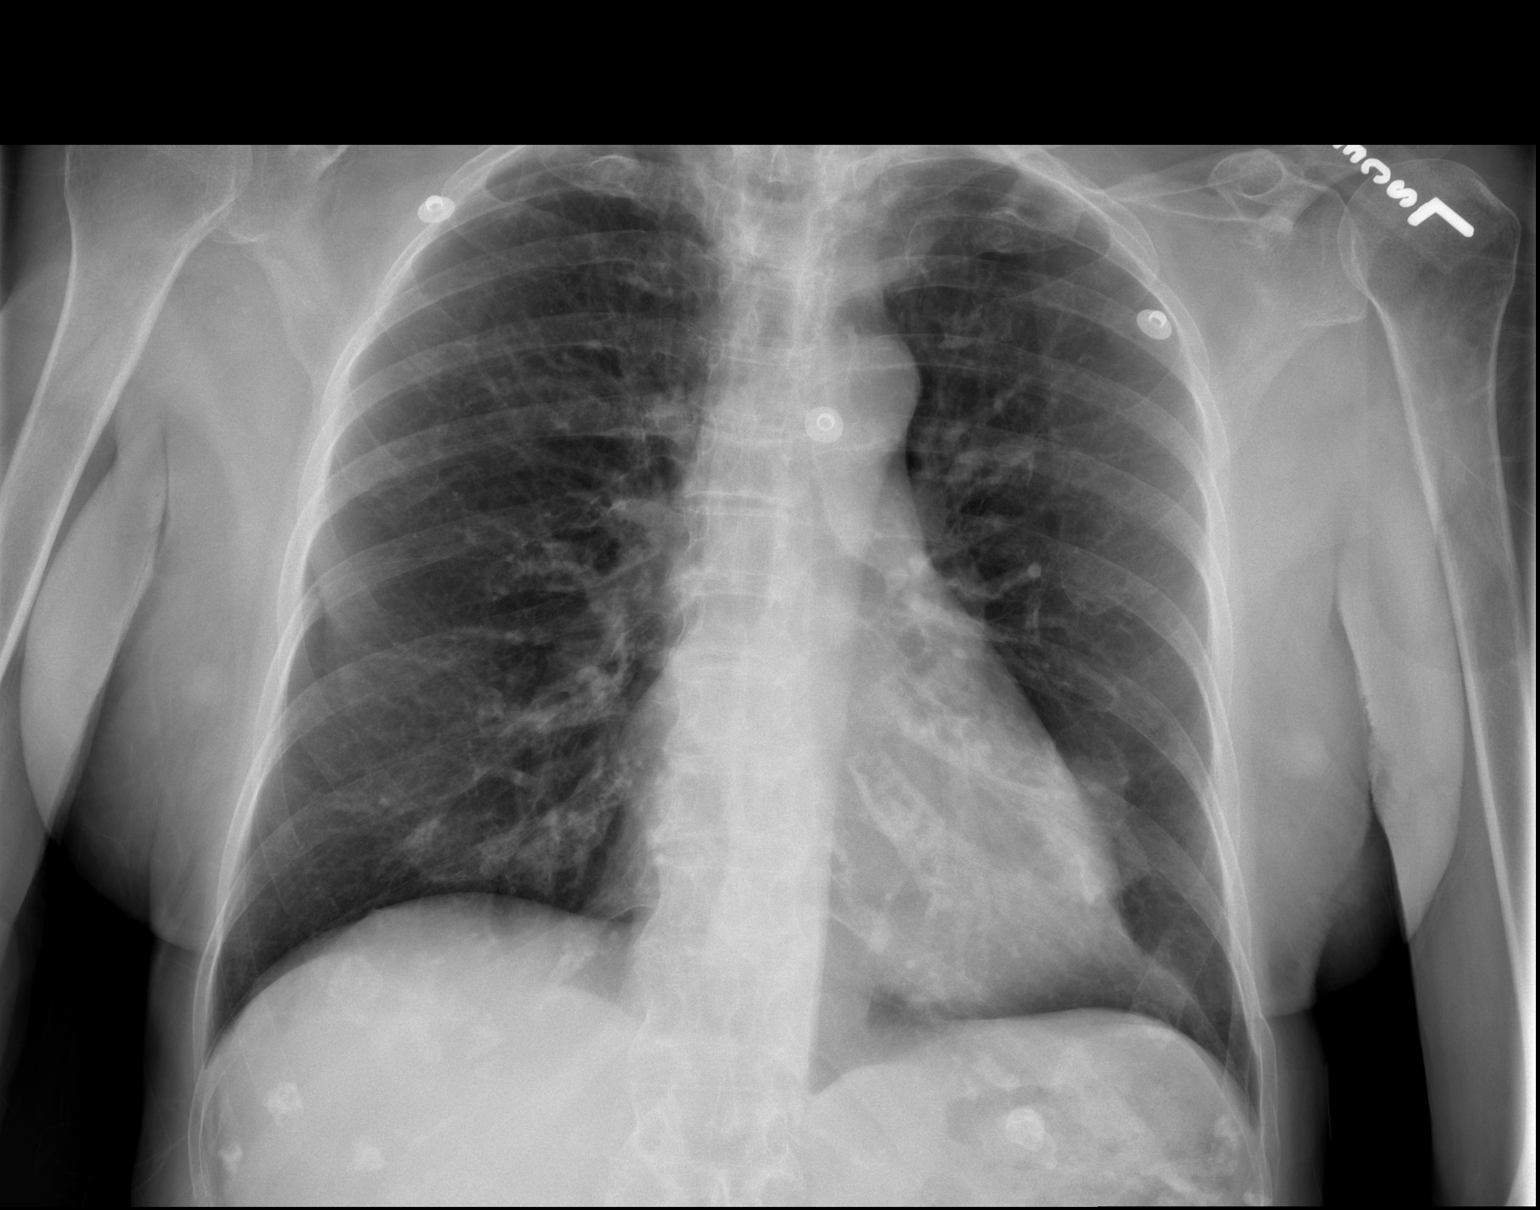

[1 of 1 positions shown; findings below may reference images not displayed]

PROCEDURE:     DXR - DXR CHEST 1 VIEWAP OR PA  - November 07, 2012  [DATE]

RESULT:     Comparison is made to the study October 19, 2010.

The lungs are well-expanded. The interstitial markings are increased which
may be related to the supine positioning. There is a coarse density
projecting over the lateral aspect of the left heart border which is not
new. The cardiac silhouette is normal in size. The clavicles appear intact.
No acute displaced rib fracture is evident.
IMPRESSION: 1. There are findings consistent with COPD and likely pulmonary fibrosis.
There may be pleural plaques present as well.
2. There is no evidence of pneumonia nor CHF.

[REDACTED]

## 2013-06-10 IMAGING — CT CT HEAD WITHOUT CONTRAST
3 series · 17 of 30 positions shown, 19 images · non-contrast
Comparison: none

REASON FOR EXAM: change in ms
COMMENTS:

[Series 2: soft tissue · axial · 0.40mm/px · z∈[-14,+101]mm · 7 of 31 slices shown]
[im 4/31  brain]
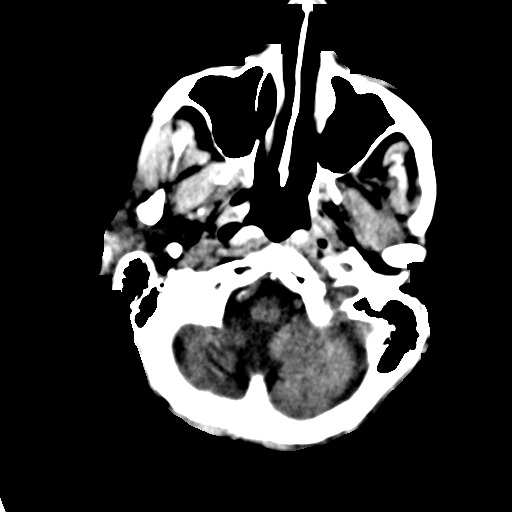
[im 8/31  brain]
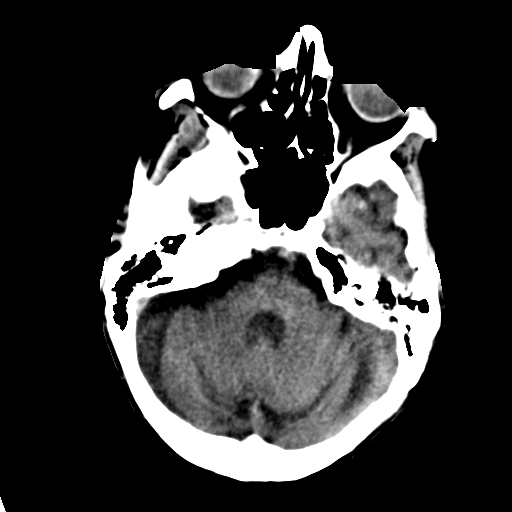
[im 12/31  brain]
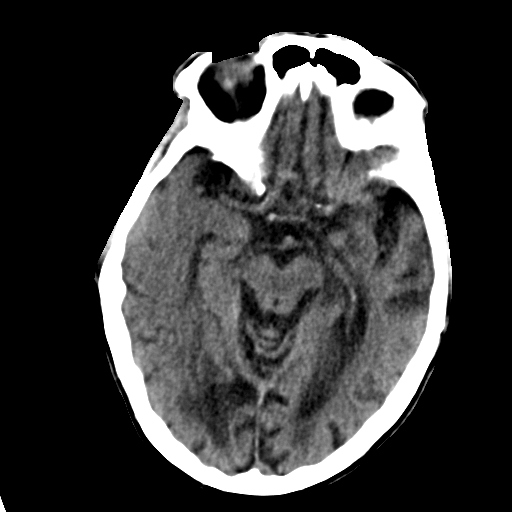
[im 16/31  brain]
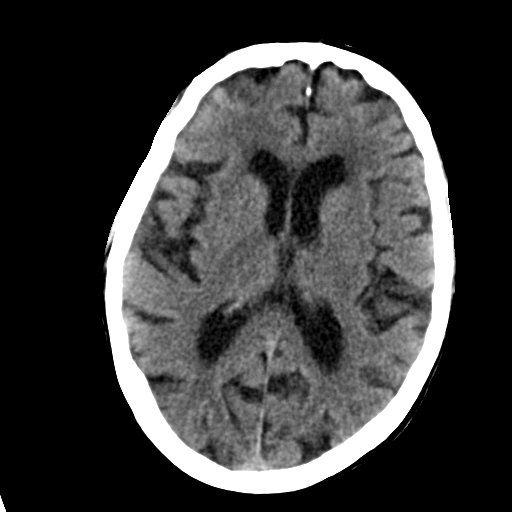
[im 19/31  brain]
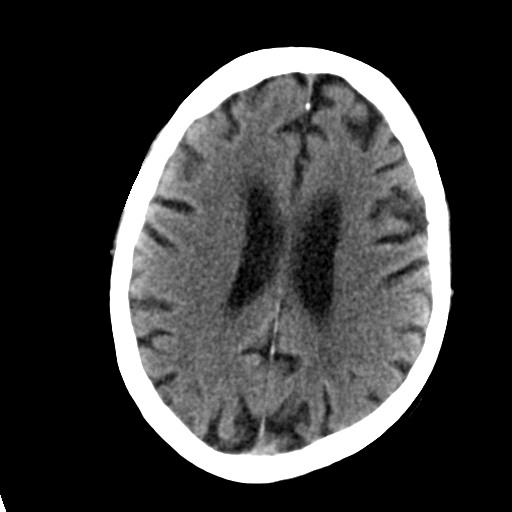
[im 23/31  brain]
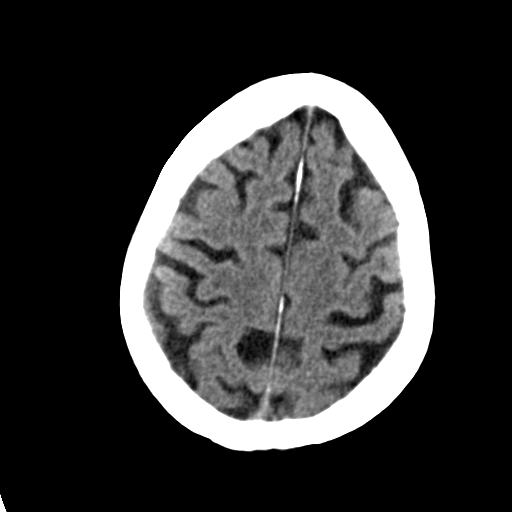
[im 27/31  brain]
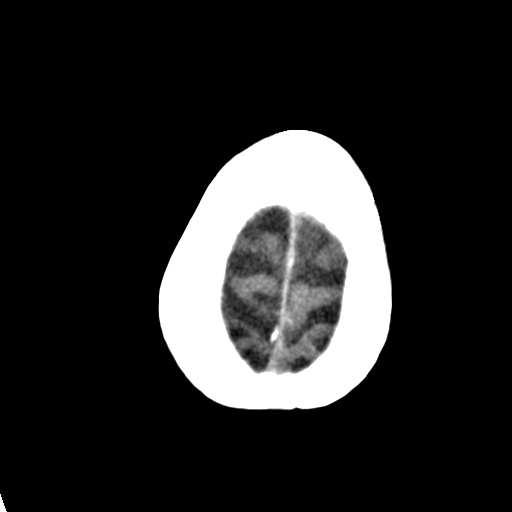

[Series 4: soft tissue 2 · axial · 0.40mm/px · z∈[-10,+110]mm · 8 of 32 slices shown, 10 images]
[im 4/32  brain]
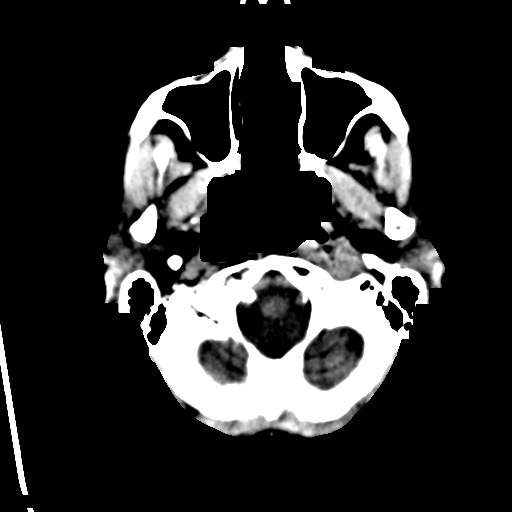
[im 4/32  bone]
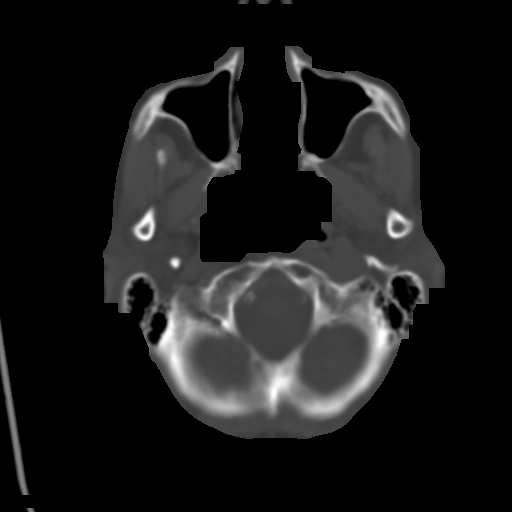
[im 7/32  brain]
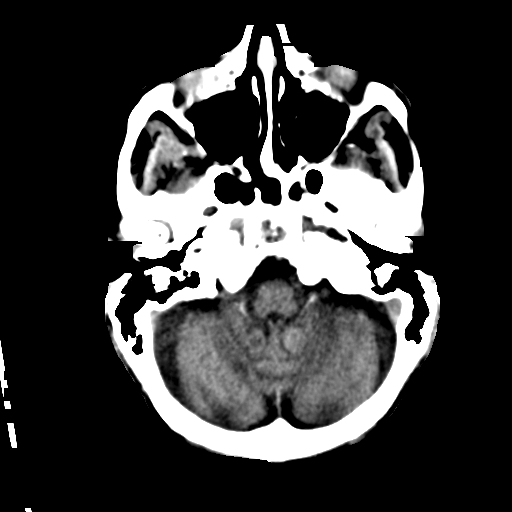
[im 11/32  brain]
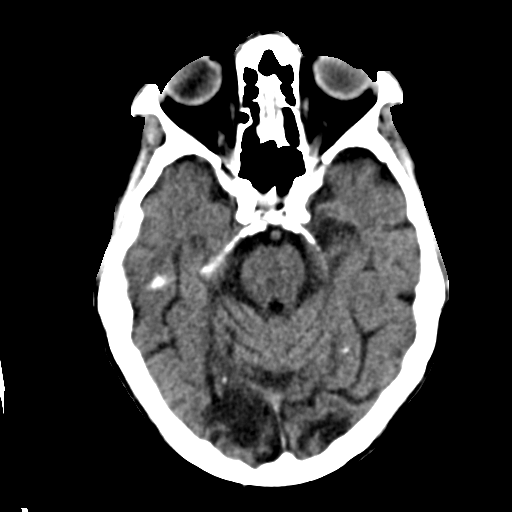
[im 14/32  brain]
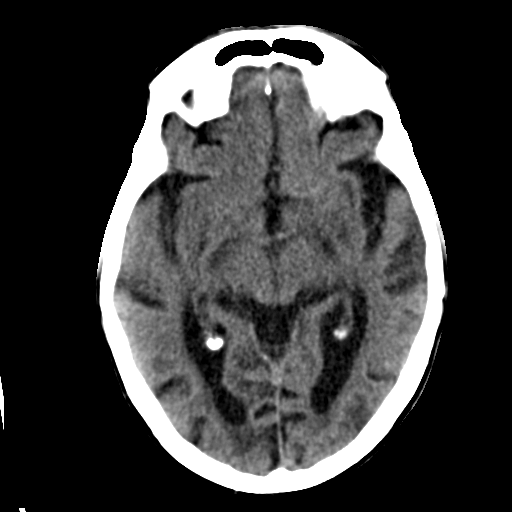
[im 18/32  brain]
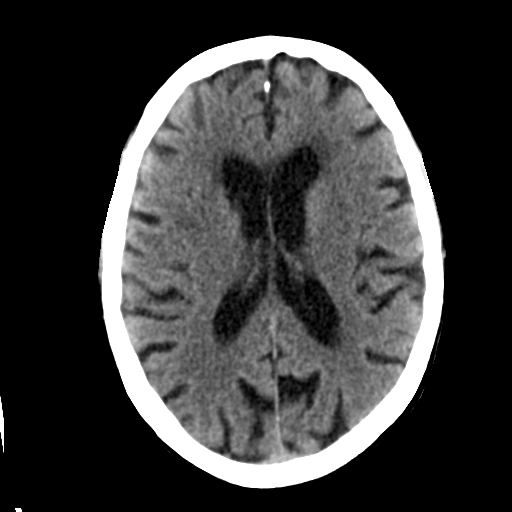
[im 18/32  bone]
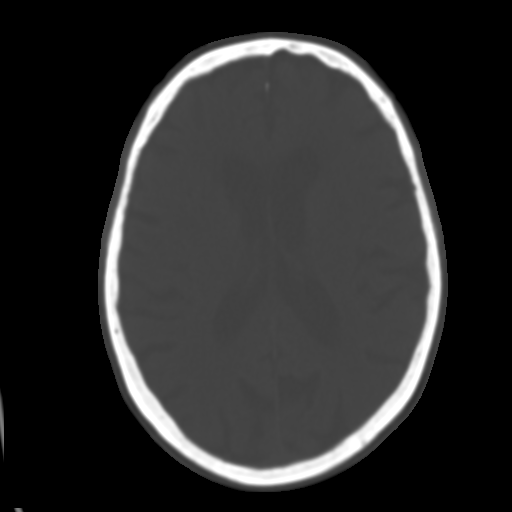
[im 21/32  brain]
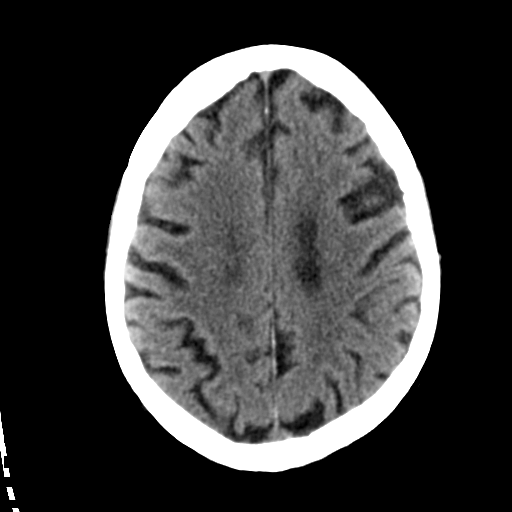
[im 25/32  brain]
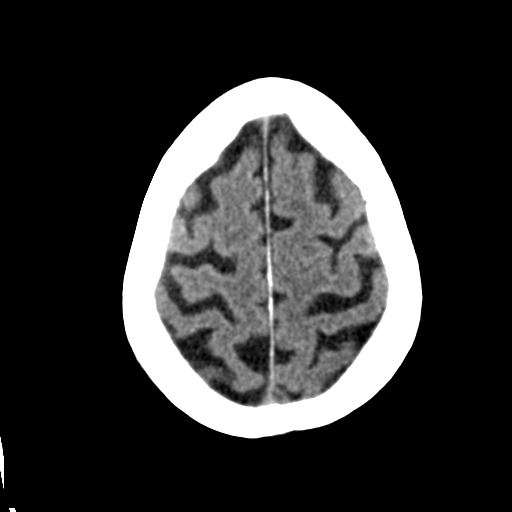
[im 28/32  brain]
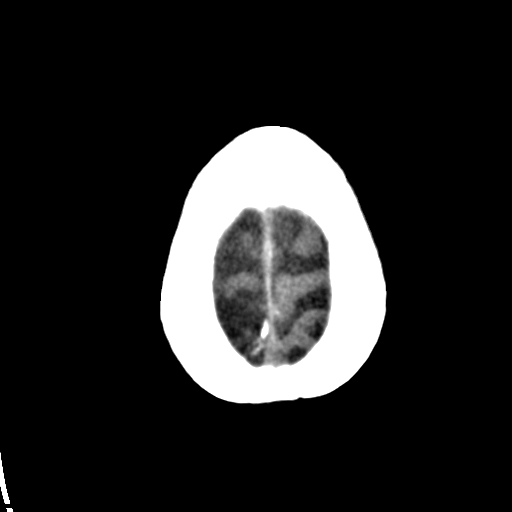

[Series 5: bone 2 · axial · 0.40mm/px · z∈[-11,+4]mm · 2 of 34 slices shown]
[im 4/34  bone]
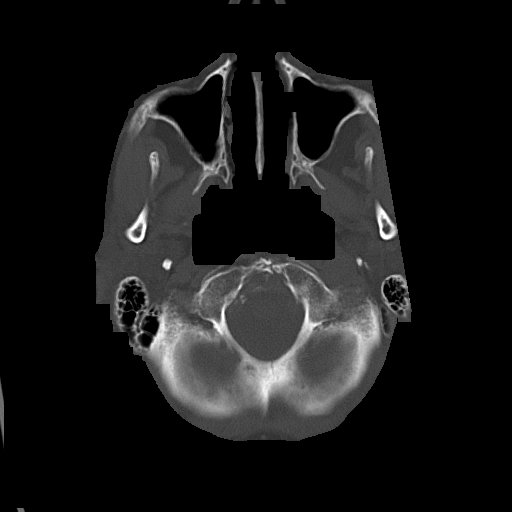
[im 7/34  bone]
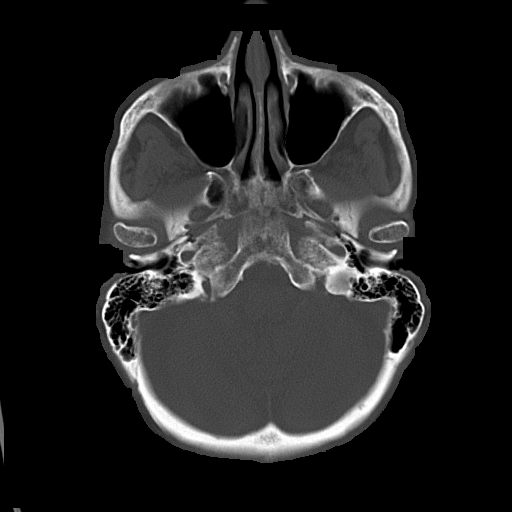

[17 of 30 positions shown; findings below may reference images not displayed]

PROCEDURE:     CT  - CT HEAD WITHOUT CONTRAST  - November 07, 2012 [DATE]

RESULT:     Axial noncontrast CT scanning was performed through the brain
with reconstructions at 5 mm intervals and slice thicknesses. Review of
multiplanar reconstructed images was performed separately on the VIA monitor.

There is moderate diffuse cerebral and cerebellar atrophy with compensatory
ventriculomegaly. There is encephalomalacia in the right occipital lobe.
These findings are stable. There is no evidence of an acute intracranial
hemorrhage nor of an evolving ischemic infarction. There is decreased
density in the basal ganglia bilaterally consistent with previous lacunar
infarctions.

At bone window settings the observed portions of the paranasal sinuses and
mastoid air cells are clear. There is no evidence of an acute skull fracture.
IMPRESSION: 1. There is no evidence of an acute intracranial hemorrhage nor of an
evolving ischemic infarction.
2. There is moderate diffuse cerebral and cerebellar atrophy with
compensatory ventriculomegaly which is stable. There is stable
encephalomalacia in the right occipital lobe. There are stable white matter
density changes consistent with chronic small vessel ischemia.

[REDACTED]

## 2013-06-15 ENCOUNTER — Other Ambulatory Visit: Payer: Self-pay | Admitting: Internal Medicine

## 2013-06-15 LAB — URINALYSIS, COMPLETE
Blood: NEGATIVE
Ketone: NEGATIVE
Ph: 5 (ref 4.5–8.0)

## 2013-07-08 ENCOUNTER — Encounter: Payer: Self-pay | Admitting: Internal Medicine

## 2013-08-08 ENCOUNTER — Ambulatory Visit: Payer: Self-pay | Admitting: Nurse Practitioner

## 2013-08-08 ENCOUNTER — Encounter: Payer: Self-pay | Admitting: Internal Medicine

## 2013-08-19 LAB — RAPID INFLUENZA A&B ANTIGENS (ARMC ONLY)

## 2013-08-22 LAB — BASIC METABOLIC PANEL
ANION GAP: 7 (ref 7–16)
BUN: 24 mg/dL — AB (ref 7–18)
CHLORIDE: 107 mmol/L (ref 98–107)
Calcium, Total: 9.5 mg/dL (ref 8.5–10.1)
Co2: 23 mmol/L (ref 21–32)
Creatinine: 1.41 mg/dL — ABNORMAL HIGH (ref 0.60–1.30)
EGFR (Non-African Amer.): 32 — ABNORMAL LOW
GFR CALC AF AMER: 37 — AB
GLUCOSE: 162 mg/dL — AB (ref 65–99)
OSMOLALITY: 281 (ref 275–301)
Potassium: 4.1 mmol/L (ref 3.5–5.1)
SODIUM: 137 mmol/L (ref 136–145)

## 2013-08-22 LAB — HEMOGLOBIN A1C: Hemoglobin A1C: 7 % — ABNORMAL HIGH (ref 4.2–6.3)

## 2013-08-22 LAB — TSH: Thyroid Stimulating Horm: 0.524 u[IU]/mL

## 2013-09-08 ENCOUNTER — Ambulatory Visit: Payer: Self-pay | Admitting: Nurse Practitioner

## 2013-09-08 ENCOUNTER — Encounter: Payer: Self-pay | Admitting: Internal Medicine

## 2013-10-06 ENCOUNTER — Ambulatory Visit: Payer: Self-pay | Admitting: Nurse Practitioner

## 2013-10-06 ENCOUNTER — Encounter: Payer: Self-pay | Admitting: Internal Medicine

## 2013-10-28 LAB — URINALYSIS, COMPLETE
BLOOD: NEGATIVE
Bilirubin,UR: NEGATIVE
Glucose,UR: NEGATIVE mg/dL (ref 0–75)
Hyaline Cast: 18
Ketone: NEGATIVE
Nitrite: NEGATIVE
PH: 5 (ref 4.5–8.0)
Protein: NEGATIVE
Specific Gravity: 1.013 (ref 1.003–1.030)
Squamous Epithelial: 1
WBC UR: 156 /HPF (ref 0–5)

## 2013-10-31 LAB — URINE CULTURE

## 2013-11-06 ENCOUNTER — Encounter: Payer: Self-pay | Admitting: Internal Medicine

## 2013-11-06 ENCOUNTER — Ambulatory Visit: Payer: Self-pay | Admitting: Nurse Practitioner

## 2013-12-06 ENCOUNTER — Ambulatory Visit: Payer: Self-pay | Admitting: Nurse Practitioner

## 2013-12-06 ENCOUNTER — Encounter: Payer: Self-pay | Admitting: Internal Medicine

## 2014-01-06 ENCOUNTER — Encounter: Payer: Self-pay | Admitting: Internal Medicine

## 2014-01-06 ENCOUNTER — Ambulatory Visit
Admit: 2014-01-06 | Disposition: A | Discharge status: Home / Self Care | Payer: Self-pay | Attending: Nurse Practitioner | Admitting: Nurse Practitioner

## 2014-02-05 ENCOUNTER — Ambulatory Visit
Admit: 2014-02-05 | Disposition: A | Discharge status: Home / Self Care | Payer: Self-pay | Attending: Nurse Practitioner | Admitting: Nurse Practitioner

## 2014-02-05 ENCOUNTER — Encounter: Payer: Self-pay | Admitting: Internal Medicine

## 2014-02-20 LAB — BASIC METABOLIC PANEL
Anion Gap: 6 — ABNORMAL LOW (ref 7–16)
BUN: 23 mg/dL — AB (ref 7–18)
CALCIUM: 9.4 mg/dL (ref 8.5–10.1)
CHLORIDE: 107 mmol/L (ref 98–107)
Co2: 29 mmol/L (ref 21–32)
Creatinine: 1.09 mg/dL (ref 0.60–1.30)
EGFR (Non-African Amer.): 43 — ABNORMAL LOW
GFR CALC AF AMER: 50 — AB
GLUCOSE: 141 mg/dL — AB (ref 65–99)
OSMOLALITY: 289 (ref 275–301)
POTASSIUM: 3.8 mmol/L (ref 3.5–5.1)
Sodium: 142 mmol/L (ref 136–145)

## 2014-02-20 LAB — HEMOGLOBIN A1C: Hemoglobin A1C: 8.7 % — ABNORMAL HIGH (ref 4.2–6.3)

## 2014-03-08 ENCOUNTER — Encounter: Payer: Self-pay | Admitting: Internal Medicine

## 2014-03-27 LAB — URINALYSIS, COMPLETE
Bilirubin,UR: NEGATIVE
Blood: NEGATIVE
Glucose,UR: NEGATIVE mg/dL (ref 0–75)
KETONE: NEGATIVE
Nitrite: NEGATIVE
PH: 6 (ref 4.5–8.0)
PROTEIN: NEGATIVE
SPECIFIC GRAVITY: 1.011 (ref 1.003–1.030)
Squamous Epithelial: 27

## 2014-03-29 LAB — URINE CULTURE

## 2014-04-08 ENCOUNTER — Encounter: Payer: Self-pay | Admitting: Internal Medicine

## 2014-05-06 LAB — URINALYSIS, COMPLETE
Bilirubin,UR: NEGATIVE
Blood: NEGATIVE
Glucose,UR: NEGATIVE mg/dL (ref 0–75)
Hyaline Cast: 12
KETONE: NEGATIVE
NITRITE: NEGATIVE
Ph: 5 (ref 4.5–8.0)
Protein: NEGATIVE
RBC,UR: 4 /HPF (ref 0–5)
Specific Gravity: 1.01 (ref 1.003–1.030)
Squamous Epithelial: NONE SEEN
WBC UR: 95 /HPF (ref 0–5)

## 2014-05-08 LAB — URINE CULTURE

## 2014-05-09 ENCOUNTER — Inpatient Hospital Stay: Payer: Self-pay | Admitting: Orthopedic Surgery

## 2014-05-09 LAB — URINALYSIS, COMPLETE
Bilirubin,UR: NEGATIVE
Glucose,UR: NEGATIVE mg/dL (ref 0–75)
Ketone: NEGATIVE
Nitrite: NEGATIVE
Ph: 5 (ref 4.5–8.0)
Protein: 30
RBC,UR: 119 /HPF (ref 0–5)
Specific Gravity: 1.009 (ref 1.003–1.030)
Squamous Epithelial: NONE SEEN
WBC UR: 2021 /HPF (ref 0–5)

## 2014-05-09 LAB — CBC WITH DIFFERENTIAL/PLATELET
Basophil #: 0.1 10*3/uL (ref 0.0–0.1)
Basophil %: 0.7 %
Eosinophil #: 0.2 10*3/uL (ref 0.0–0.7)
Eosinophil %: 2.3 %
HCT: 33.5 % — ABNORMAL LOW (ref 35.0–47.0)
HGB: 10.4 g/dL — ABNORMAL LOW (ref 12.0–16.0)
Lymphocyte #: 2.2 10*3/uL (ref 1.0–3.6)
Lymphocyte %: 25.4 %
MCH: 29.9 pg (ref 26.0–34.0)
MCHC: 30.9 g/dL — ABNORMAL LOW (ref 32.0–36.0)
MCV: 97 fL (ref 80–100)
Monocyte #: 0.9 x10 3/mm (ref 0.2–0.9)
Monocyte %: 10.4 %
Neutrophil #: 5.4 10*3/uL (ref 1.4–6.5)
Neutrophil %: 61.2 %
Platelet: 298 10*3/uL (ref 150–440)
RBC: 3.47 10*6/uL — ABNORMAL LOW (ref 3.80–5.20)
RDW: 13.1 % (ref 11.5–14.5)
WBC: 8.8 10*3/uL (ref 3.6–11.0)

## 2014-05-09 LAB — BASIC METABOLIC PANEL
Anion Gap: 6 — ABNORMAL LOW (ref 7–16)
BUN: 27 mg/dL — ABNORMAL HIGH (ref 7–18)
Calcium, Total: 9.1 mg/dL (ref 8.5–10.1)
Chloride: 103 mmol/L (ref 98–107)
Co2: 27 mmol/L (ref 21–32)
Creatinine: 1.21 mg/dL (ref 0.60–1.30)
Glucose: 162 mg/dL — ABNORMAL HIGH (ref 65–99)
Osmolality: 281 (ref 275–301)
Potassium: 4.3 mmol/L (ref 3.5–5.1)
Sodium: 136 mmol/L (ref 136–145)

## 2014-05-09 LAB — APTT: ACTIVATED PTT: 27.2 s (ref 23.6–35.9)

## 2014-05-09 LAB — PROTIME-INR
INR: 0.9
Prothrombin Time: 12.5 secs (ref 11.5–14.7)

## 2014-05-10 LAB — CBC WITH DIFFERENTIAL/PLATELET
BASOS ABS: 0.1 10*3/uL (ref 0.0–0.1)
BASOS PCT: 0.5 %
EOS ABS: 0.1 10*3/uL (ref 0.0–0.7)
EOS PCT: 1.3 %
HCT: 30.1 % — AB (ref 35.0–47.0)
HGB: 10 g/dL — AB (ref 12.0–16.0)
Lymphocyte #: 1.8 10*3/uL (ref 1.0–3.6)
Lymphocyte %: 16.4 %
MCH: 31.3 pg (ref 26.0–34.0)
MCHC: 33.2 g/dL (ref 32.0–36.0)
MCV: 94 fL (ref 80–100)
Monocyte #: 1 x10 3/mm — ABNORMAL HIGH (ref 0.2–0.9)
Monocyte %: 9.1 %
NEUTROS ABS: 8 10*3/uL — AB (ref 1.4–6.5)
Neutrophil %: 72.7 %
Platelet: 310 10*3/uL (ref 150–440)
RBC: 3.19 10*6/uL — AB (ref 3.80–5.20)
RDW: 13.1 % (ref 11.5–14.5)
WBC: 11 10*3/uL (ref 3.6–11.0)

## 2014-05-10 LAB — MAGNESIUM: Magnesium: 1.8 mg/dL

## 2014-05-10 LAB — BASIC METABOLIC PANEL
ANION GAP: 8 (ref 7–16)
BUN: 25 mg/dL — AB (ref 7–18)
CREATININE: 1.17 mg/dL (ref 0.60–1.30)
Calcium, Total: 8.8 mg/dL (ref 8.5–10.1)
Chloride: 106 mmol/L (ref 98–107)
Co2: 25 mmol/L (ref 21–32)
EGFR (African American): 55 — ABNORMAL LOW
GFR CALC NON AF AMER: 46 — AB
Glucose: 193 mg/dL — ABNORMAL HIGH (ref 65–99)
OSMOLALITY: 287 (ref 275–301)
Potassium: 4 mmol/L (ref 3.5–5.1)
SODIUM: 139 mmol/L (ref 136–145)

## 2014-05-11 LAB — CBC WITH DIFFERENTIAL/PLATELET
BASOS PCT: 0.4 %
Basophil #: 0 10*3/uL (ref 0.0–0.1)
Eosinophil #: 0.1 10*3/uL (ref 0.0–0.7)
Eosinophil %: 0.9 %
HCT: 25.4 % — ABNORMAL LOW (ref 35.0–47.0)
HGB: 8.4 g/dL — ABNORMAL LOW (ref 12.0–16.0)
Lymphocyte #: 1.6 10*3/uL (ref 1.0–3.6)
Lymphocyte %: 17.4 %
MCH: 31.6 pg (ref 26.0–34.0)
MCHC: 33.2 g/dL (ref 32.0–36.0)
MCV: 95 fL (ref 80–100)
MONO ABS: 1.1 x10 3/mm — AB (ref 0.2–0.9)
Monocyte %: 12.6 %
Neutrophil #: 6.2 10*3/uL (ref 1.4–6.5)
Neutrophil %: 68.7 %
Platelet: 258 10*3/uL (ref 150–440)
RBC: 2.67 10*6/uL — AB (ref 3.80–5.20)
RDW: 12.9 % (ref 11.5–14.5)
WBC: 9.1 10*3/uL (ref 3.6–11.0)

## 2014-05-11 LAB — BASIC METABOLIC PANEL
Anion Gap: 10 (ref 7–16)
BUN: 19 mg/dL — AB (ref 7–18)
CREATININE: 0.96 mg/dL (ref 0.60–1.30)
Calcium, Total: 8.3 mg/dL — ABNORMAL LOW (ref 8.5–10.1)
Chloride: 109 mmol/L — ABNORMAL HIGH (ref 98–107)
Co2: 22 mmol/L (ref 21–32)
EGFR (African American): >60
GFR CALC NON AF AMER: 57 — AB
Glucose: 158 mg/dL — ABNORMAL HIGH (ref 65–99)
OSMOLALITY: 287 (ref 275–301)
Potassium: 4.4 mmol/L (ref 3.5–5.1)
Sodium: 141 mmol/L (ref 136–145)

## 2014-05-11 LAB — URINE CULTURE

## 2014-05-12 LAB — CBC WITH DIFFERENTIAL/PLATELET
BASOS ABS: 0 10*3/uL (ref 0.0–0.1)
Basophil %: 0.3 %
EOS ABS: 0.2 10*3/uL (ref 0.0–0.7)
Eosinophil %: 1.5 %
HCT: 24.2 % — AB (ref 35.0–47.0)
HGB: 7.9 g/dL — ABNORMAL LOW (ref 12.0–16.0)
Lymphocyte #: 1.8 10*3/uL (ref 1.0–3.6)
Lymphocyte %: 16.3 %
MCH: 31.4 pg (ref 26.0–34.0)
MCHC: 32.7 g/dL (ref 32.0–36.0)
MCV: 96 fL (ref 80–100)
MONOS PCT: 12 %
Monocyte #: 1.3 x10 3/mm — ABNORMAL HIGH (ref 0.2–0.9)
NEUTROS ABS: 7.6 10*3/uL — AB (ref 1.4–6.5)
Neutrophil %: 69.9 %
PLATELETS: 263 10*3/uL (ref 150–440)
RBC: 2.51 10*6/uL — ABNORMAL LOW (ref 3.80–5.20)
RDW: 13.4 % (ref 11.5–14.5)
WBC: 10.9 10*3/uL (ref 3.6–11.0)

## 2014-05-12 LAB — BASIC METABOLIC PANEL
ANION GAP: 8 (ref 7–16)
BUN: 18 mg/dL (ref 7–18)
Calcium, Total: 8.5 mg/dL (ref 8.5–10.1)
Chloride: 112 mmol/L — ABNORMAL HIGH (ref 98–107)
Co2: 23 mmol/L (ref 21–32)
Creatinine: 1 mg/dL (ref 0.60–1.30)
EGFR (African American): >60
EGFR (Non-African Amer.): 55 — ABNORMAL LOW
GLUCOSE: 142 mg/dL — AB (ref 65–99)
OSMOLALITY: 289 (ref 275–301)
POTASSIUM: 4.3 mmol/L (ref 3.5–5.1)
SODIUM: 143 mmol/L (ref 136–145)

## 2014-05-13 LAB — CBC WITH DIFFERENTIAL/PLATELET
BASOS ABS: 0 10*3/uL (ref 0.0–0.1)
BASOS PCT: 0.3 %
Basophil #: 0 10*3/uL (ref 0.0–0.1)
Basophil %: 0.2 %
EOS PCT: 1.9 %
Eosinophil #: 0.2 10*3/uL (ref 0.0–0.7)
Eosinophil #: 0.2 10*3/uL (ref 0.0–0.7)
Eosinophil %: 1.9 %
HCT: 23.9 % — ABNORMAL LOW (ref 35.0–47.0)
HCT: 30.9 % — AB (ref 35.0–47.0)
HGB: 7.5 g/dL — AB (ref 12.0–16.0)
HGB: 9.8 g/dL — ABNORMAL LOW (ref 12.0–16.0)
LYMPHS PCT: 12.9 %
Lymphocyte #: 1.2 10*3/uL (ref 1.0–3.6)
Lymphocyte #: 1.6 10*3/uL (ref 1.0–3.6)
Lymphocyte %: 17.1 %
MCH: 30.7 pg (ref 26.0–34.0)
MCH: 31 pg (ref 26.0–34.0)
MCHC: 31.4 g/dL — AB (ref 32.0–36.0)
MCHC: 31.9 g/dL — AB (ref 32.0–36.0)
MCV: 98 fL (ref 80–100)
MCV: 97 fL (ref 80–100)
Monocyte #: 0.7 x10 3/mm (ref 0.2–0.9)
Monocyte #: 0.9 x10 3/mm (ref 0.2–0.9)
Monocyte %: 7.8 %
Monocyte %: 9.7 %
NEUTROS ABS: 7.3 10*3/uL — AB (ref 1.4–6.5)
Neutrophil #: 6.5 10*3/uL (ref 1.4–6.5)
Neutrophil %: 77.1 %
Neutrophil %: 71.1 %
Platelet: 290 10*3/uL (ref 150–440)
Platelet: 288 10*3/uL (ref 150–440)
RBC: 2.45 10*6/uL — ABNORMAL LOW (ref 3.80–5.20)
RBC: 3.18 10*6/uL — ABNORMAL LOW (ref 3.80–5.20)
RDW: 13.5 % (ref 11.5–14.5)
RDW: 13.5 % (ref 11.5–14.5)
WBC: 9.4 10*3/uL (ref 3.6–11.0)
WBC: 9.1 10*3/uL (ref 3.6–11.0)

## 2014-05-13 LAB — BASIC METABOLIC PANEL
ANION GAP: 6 — AB (ref 7–16)
BUN: 15 mg/dL (ref 7–18)
Calcium, Total: 8.6 mg/dL (ref 8.5–10.1)
Chloride: 116 mmol/L — ABNORMAL HIGH (ref 98–107)
Co2: 22 mmol/L (ref 21–32)
Creatinine: 1 mg/dL (ref 0.60–1.30)
EGFR (Non-African Amer.): 55 — ABNORMAL LOW
Glucose: 206 mg/dL — ABNORMAL HIGH (ref 65–99)
OSMOLALITY: 294 (ref 275–301)
POTASSIUM: 4.4 mmol/L (ref 3.5–5.1)
Sodium: 144 mmol/L (ref 136–145)

## 2014-05-14 ENCOUNTER — Encounter: Payer: Self-pay | Admitting: Internal Medicine

## 2014-05-22 LAB — CLOSTRIDIUM DIFFICILE(ARMC)

## 2014-05-27 LAB — URINALYSIS, COMPLETE
BACTERIA: NONE SEEN
BILIRUBIN, UR: NEGATIVE
BLOOD: NEGATIVE
Glucose,UR: NEGATIVE mg/dL (ref 0–75)
Ketone: NEGATIVE
LEUKOCYTE ESTERASE: NEGATIVE
Nitrite: NEGATIVE
Ph: 6 (ref 4.5–8.0)
Protein: NEGATIVE
RBC,UR: NONE SEEN /HPF (ref 0–5)
SPECIFIC GRAVITY: 1.009 (ref 1.003–1.030)
Squamous Epithelial: NONE SEEN
WBC UR: 2 /HPF (ref 0–5)

## 2014-05-29 LAB — URINE CULTURE

## 2014-06-08 ENCOUNTER — Encounter: Payer: Self-pay | Admitting: Internal Medicine

## 2014-08-01 ENCOUNTER — Emergency Department: Payer: Self-pay | Admitting: Emergency Medicine

## 2014-08-01 LAB — COMPREHENSIVE METABOLIC PANEL
ALT: 18 U/L
ANION GAP: 9 (ref 7–16)
Albumin: 2.8 g/dL — ABNORMAL LOW (ref 3.4–5.0)
Alkaline Phosphatase: 152 U/L — ABNORMAL HIGH
BUN: 22 mg/dL — AB (ref 7–18)
Bilirubin,Total: 0.2 mg/dL (ref 0.2–1.0)
CHLORIDE: 103 mmol/L (ref 98–107)
Calcium, Total: 8.9 mg/dL (ref 8.5–10.1)
Co2: 27 mmol/L (ref 21–32)
Creatinine: 1.28 mg/dL (ref 0.60–1.30)
EGFR (African American): 50 — ABNORMAL LOW
EGFR (Non-African Amer.): 41 — ABNORMAL LOW
Glucose: 300 mg/dL — ABNORMAL HIGH (ref 65–99)
Osmolality: 292 (ref 275–301)
Potassium: 4.4 mmol/L (ref 3.5–5.1)
SGOT(AST): 25 U/L (ref 15–37)
Sodium: 139 mmol/L (ref 136–145)
Total Protein: 6.7 g/dL (ref 6.4–8.2)

## 2014-08-01 LAB — URINALYSIS, COMPLETE
BILIRUBIN, UR: NEGATIVE
Blood: NEGATIVE
Glucose,UR: 50 mg/dL (ref 0–75)
Hyaline Cast: 6
Ketone: NEGATIVE
NITRITE: NEGATIVE
PH: 5 (ref 4.5–8.0)
Protein: NEGATIVE
RBC,UR: 1 /HPF (ref 0–5)
Specific Gravity: 1.011 (ref 1.003–1.030)
Squamous Epithelial: NONE SEEN

## 2014-08-01 LAB — APTT: Activated PTT: 25.2 secs (ref 23.6–35.9)

## 2014-08-01 LAB — PROTIME-INR
INR: 0.9
Prothrombin Time: 12.2 secs (ref 11.5–14.7)

## 2014-08-01 LAB — HEMOGLOBIN: HGB: 10.4 g/dL — AB (ref 12.0–16.0)

## 2014-08-05 ENCOUNTER — Encounter: Payer: Self-pay | Admitting: Internal Medicine

## 2014-08-05 LAB — CBC WITH DIFFERENTIAL/PLATELET
BASOS ABS: 0 10*3/uL (ref 0.0–0.1)
Basophil %: 0.6 %
EOS PCT: 2.7 %
Eosinophil #: 0.2 10*3/uL (ref 0.0–0.7)
HCT: 34.1 % — ABNORMAL LOW (ref 35.0–47.0)
HGB: 10.9 g/dL — ABNORMAL LOW (ref 12.0–16.0)
Lymphocyte #: 2.4 10*3/uL (ref 1.0–3.6)
Lymphocyte %: 31.5 %
MCH: 30.5 pg (ref 26.0–34.0)
MCHC: 32.1 g/dL (ref 32.0–36.0)
MCV: 95 fL (ref 80–100)
Monocyte #: 0.8 x10 3/mm (ref 0.2–0.9)
Monocyte %: 10.3 %
NEUTROS PCT: 54.9 %
Neutrophil #: 4.2 10*3/uL (ref 1.4–6.5)
Platelet: 271 10*3/uL (ref 150–440)
RBC: 3.58 10*6/uL — ABNORMAL LOW (ref 3.80–5.20)
RDW: 13.6 % (ref 11.5–14.5)
WBC: 7.6 10*3/uL (ref 3.6–11.0)

## 2014-08-12 ENCOUNTER — Encounter: Payer: Self-pay | Admitting: Internal Medicine

## 2014-08-12 LAB — CBC WITH DIFFERENTIAL/PLATELET
BASOS ABS: 0.1 10*3/uL (ref 0.0–0.1)
Basophil %: 0.8 %
EOS ABS: 0.2 10*3/uL (ref 0.0–0.7)
EOS PCT: 3.4 %
HCT: 32.2 % — AB (ref 35.0–47.0)
HGB: 10.4 g/dL — ABNORMAL LOW (ref 12.0–16.0)
LYMPHS ABS: 2.6 10*3/uL (ref 1.0–3.6)
LYMPHS PCT: 37 %
MCH: 30.6 pg (ref 26.0–34.0)
MCHC: 32.2 g/dL (ref 32.0–36.0)
MCV: 95 fL (ref 80–100)
MONOS PCT: 9.8 %
Monocyte #: 0.7 x10 3/mm (ref 0.2–0.9)
NEUTROS ABS: 3.5 10*3/uL (ref 1.4–6.5)
Neutrophil %: 49 %
PLATELETS: 263 10*3/uL (ref 150–440)
RBC: 3.39 10*6/uL — AB (ref 3.80–5.20)
RDW: 13.5 % (ref 11.5–14.5)
WBC: 7.1 10*3/uL (ref 3.6–11.0)

## 2014-09-05 LAB — EXPECTORATED SPUTUM ASSESSMENT W REFEX TO RESP CULTURE

## 2014-09-08 ENCOUNTER — Encounter: Payer: Self-pay | Admitting: Internal Medicine

## 2014-10-07 ENCOUNTER — Encounter
Admit: 2014-10-07 | Disposition: A | Discharge status: Home / Self Care | Payer: Self-pay | Attending: Internal Medicine | Admitting: Internal Medicine

## 2014-11-07 ENCOUNTER — Encounter
Admit: 2014-11-07 | Disposition: A | Discharge status: Home / Self Care | Payer: Self-pay | Attending: Internal Medicine | Admitting: Internal Medicine

## 2014-11-29 NOTE — Discharge Summary (Signed)
PATIENT NAME:  Bethany EdenWALKER, Ronnette T MR#:  [id number] DATE OF BIRTH:  July 23, 1916  DATE OF ADMISSION:  05/09/2014 DATE OF DISCHARGE:  05/13/2014  DISCHARGE DIAGNOSES:  1.  Hip fracture.  2.  Dementia with encephalopathy from above.  3.  Urinary tract infection.   DISCHARGE MEDICATIONS: [facility] She will basically be on her home medications with addition of analgesia and Lovenox temporarily.   HOSPITAL COURSE: The patient was admitted with hip fracture, surgery was done without complication. She was on IV Rocephin for urinary tract infection, which course should be completed as of this point. She was stable and her usual self by time to go home. She was participating with physical therapy.   Creatinine was 1.00 today's date. Hemoglobin was stable in the 7s, not requiring transfusion at this point. Please see further notes from orthopedics as they cared for her hip, which was her main issue other than the usual dementia and recurrent urinary tract infections.    ____________________________ Marya AmslerMarshall W. Dareen PianoAnderson, MD mwa:lt D: 05/13/2014 08:11:45 ET T: 05/13/2014 08:24:19 ET JOB#: [id number]  cc: Marya AmslerMarshall W. Dareen PianoAnderson, MD, <Dictator> Lauro RegulusMARSHALL W Nizhoni Parlow MD ELECTRONICALLY SIGNED 05/13/2014 11:57

## 2014-11-29 NOTE — H&P (Signed)
PATIENT NAME:  Bethany [name], Bethany [name] MR#:  [id number] DATE OF BIRTH:  [birth year]  DATE OF ADMISSION:  05/09/2014  PRIMARY CARE PHYSICIAN: Einar Crow, MD  REFERRING PHYSICIAN: Juanell Fairly, MD  CHIEF COMPLAINT: Right hip fracture.   HISTORY OF PRESENT ILLNESS: This is a 81 year old Caucasian female with multiple past medical history including COPD, DVT, and advanced dementia who was sent from nursing home for direct admission due to fall and right hip fracture. The patient is demented, only knows her name, unable to provide any information. According to the patient's daughter, the patient locked herself in her room and then later was found on the floor, appearing that the patient fell on the floor and got a hip injury. X-rays show right hip fracture so Dr. Martha Clan suggested direct admission. The patient is demented and unable to have any complaint.   PAST MEDICAL HISTORY: COPD, advanced dementia, previous CVA, hypothyroidism, hypertension, pernicious anemia, lung mass, Raynaud phenomenon, peripheral neuropathy, B12 deficiency, GERD, polymyalgia rheumatica, GI bleeding and DVT status post IVC filter placement, diabetes.   PAST SURGICAL HISTORY: Thyroid surgery, cervical spine surgery, left hip repair, left hip arthroplasty, hysterectomy, abdominal aortogram and embolization of splenic flexure for bleeding, IVC filter placement for DVT.  REVIEW OF SYSTEMS: Unable to obtain due to the patient's dementia status.  ALLERGIES: FLAGYL.   HOME MEDICATIONS: Vitamin D3 1,000 international units 2 tablets once a day, torsemide 10 mg p.o. daily, Symbicort 80/4.5 mcg inhalation 2 puffs once a day, senna S 50 mg/8.6 mg one tablet b.i.d., potassium 10 mg p.o. daily, paroxetine 30 mg p.o. daily, Namenda 10 mg p.o. b.i.d., levothyroxine 50 mcg p.o. daily, glipizide 5 mg p.o. daily.  PHYSICAL EXAMINATION: VITAL SIGNS: Temperature 97.2, blood pressure 150/85, O2 saturation was 80s in room air and 99 on oxygen  1 liter by nasal cannula.  GENERAL: The patient is weak, demented, in no acute distress.  HEENT: Pupils round, equal and reactive to light and accommodation. Moist oral mucosa. Clear oropharynx.  NECK: Supple. No JVD or carotid bruits. No lymphadenopathy. No thyromegaly.  CARDIOVASCULAR: S1 and S2 regular rate and rhythm. No murmurs or gallops. PULMONARY: Bilateral air entry. No wheezing or rales. No use of accessory muscles to breathe.  ABDOMEN: Soft. No distention. No tenderness. No organomegaly. Bowel sounds present.  EXTREMITIES: No edema, clubbing or cyanosis. Has right lower extremity deformity. The patient is unable to move  right lower extremity. Bilateral pedal pulses present.  SKIN: No rash or jaundice.  NEUROLOGIC: The patient is demented and unable to obtain at this time.  DIAGNOSTIC DATA: WBC 8.8, hemoglobin 10.4, platelets 298,000. Glucose 162, BUN 27, creatinine 1.21. Electrolytes normal. INR 0.9.   Urinalysis shows WBC 2021, RBC119, nitrite negative. Urine culture 3 days ago showed E. coli sensitive to Rocephin, but resistant to Cipro.  EKG is pending.   IMPRESSION: 1.  Right hip fracture.  2.  Urinary tract infection. 3.  Dehydration.  4.  Anemia.  5.  Chronic obstructive pulmonary disease.  6.  Advanced dementia.  7.  Hypertension.   PLAN OF TREATMENT: 1.  The patient is high risk for right hip surgery due to multiple medical problems and also age. In addition, the patient is high risk for complications after surgery including pneumonia, deep venous thrombosis and pulmonary embolus. 2.  Hold aspirin and deep vein thrombosis prophylaxis with TEDs and may start Lovenox after surgery. 3.  For urinary tract infection, we will start Rocephin IV and follow up urine culture.  4.  For dehydration, we will give normal saline IV. Follow up BMP and magnesium level.  5.  Anemia. The patient has anemia of chronic disease. We will follow up hemoglobin level after surgery.  6.  For  diabetes, we will hold glipizide due to risk of hypoglycemia and start sliding scale.  7.  For chronic obstructive pulmonary disease, we will continue Symbicort 2 puffs b.i.d. and give DuoNebs p.r.n.  8.  As mentioned above, for dehydration, we will give normal saline IV and hold torsemide.   I discussed the patient's condition and plan of treatment with the patient's daughter and Dr. Martha ClanKrasinski. The patient has high risk for operation and complications. The patient's code status is DNR, according to the patient's daughter. The patient's daughter said she will talk with her brother and talk to Dr. Martha ClanKrasinski about surgery again.   TIME SPENT: About 67 minutes.  ____________________________ Shaune PollackQing Tien Spooner, MD qc:sb D: 05/09/2014 13:37:26 ET T: 05/09/2014 14:17:18 ET JOB#: [id number]  cc: Shaune PollackQing Luisdavid Hamblin, MD, <Dictator> Shaune PollackQING Bonna Steury MD ELECTRONICALLY SIGNED 05/09/2014 15:52

## 2014-11-29 NOTE — Op Note (Signed)
PATIENT NAME:  Bethany [name], Bethany [name] MR#:  [id number] DATE OF BIRTH:  04-Dec-1915  DATE OF PROCEDURE:  05/10/2014  PREOPERATIVE DIAGNOSES: 1. Right intratrochanteric hip fracture.  2. Right distal radius fracture.   POSTOPERATIVE DIAGNOSES:  1. Right intratrochanteric hip fracture.  2. Right distal radius fracture.   PROCEDURE: Open reduction and internal fixation of right intertrochanteric hip fracture and percutaneous fixation of the right distal radius fracture.   ANESTHESIA: General.   SURGEON: Juanell Fairly, MD   ESTIMATED BLOOD LOSS: 100 mL.   COMPLICATIONS: Small skin tear over the dorsum of the right wrist during distal radius fracture reduction.   IMPLANTS: Synthes DHS 4 hole 135 degree plate with a 95 mm lag screw and 4 bicortical screws as well as two 2.0 smooth K wires for distal radius fracture fixation.   INDICATION FOR THE PROCEDURE: The patient is a 81 year old female with dementia who fell at her nursing home. She fell 4 days prior to admission. She presented to my office for evaluation of a wrist injury, but had been complaining of right hip pain. Upon x-ray of the right hip in my office she was diagnosed with a right intertrochanteric hip fracture. I had recommended surgical fixation for both fractures for pain control. I reviewed the risks and benefits of surgery with the patient's daughter who is her medical healthcare proxy. She understood the risks of surgery include infection, bleeding, nerve or blood vessel injury, leg length discrepancy, change in lower extremity rotation, persistent right hip pain as well as hardware failure and the need for further surgery including conversion to a total hip arthroplasty. Risks of the wrist fracture would include pin breakage or pin site infection or nerve or blood vessel injury in the right hand. Medical risks include, but are not limited to deep vein thrombosis and pulmonary embolism, myocardial infarction, stroke, pneumonia,  respiratory failure and death.   The patient had her right wrist and leg signed with the word "yes" according to the hospital's right site protocol. My initials were placed over these surgical sites as well. The patient was then brought to the operating room. She was placed supine on a fracture table. She underwent general anesthesia. A timeout was performed to verify the patient's name, date of birth, medical record number, correct site of surgery and correct procedure to be performed. It was also used to verify the patient had received antibiotics and all appropriate instruments, implants and radiographic studies were available in the room. Once all in attendance were in agreement, the case began.   The decision was made to start with, the patient's hip fracture. Her right lower extremity was prepped and draped in sterile fashion. The C-arm images were taken to ensure adequate reduction of the fracture. The fracture table was used for reduction. The patient had gentle traction and internal rotation placed on the right lower extremity to align the fracture anatomically. A lateral incision was made along the right thigh in line with the femur just distal to the greater trochanter. The subcutaneous tissues were dissected using electrocautery. All bleeding vessels were cauterized during exposure. The fascia lata was then identified and sharply incised with a deep #10 blade. The vastus lateralis was then split in line with its fibers to allow for placement of a 135 degree drill pin guide to be placed along the lateral cortex of the femur. A drill pin was then advanced across the fracture into the femoral head. This was measured with a depth gauge to be 95  mm. This was then overdrilled with a lag screw drill. A 4 hole plate with a 95 mm lag screw construct was then put together on the back table. This lag screw was then inserted by hand into place into the femoral head. Its position again was confirmed on AP and  lateral C-arm images. The 4-hole plate was then placed along the lateral cortex of the femur. Four bicortical screws were placed through the plate. The C-arm, AP and lateral images were used to confirm adequate placement of the plate and bicortical screws. A compression screw was then advanced into position while traction was removed from the leg. Final images of the construct were performed. The wound was copiously irrigated. The fascia lata was closed with interrupted 0 Vicryl after the vastus lateralis muscle was laid over the plate construct. Once the fascia lata was closed it was again copiously irrigated. The deep tissues were closed with a 2-0 Vicryl and the skin approximated with staples. A dry sterile dressing was applied.   The attention was then turned to the patient's right wrist. Hand tables were brought alongside the fracture table. The patient had her left leg taken out of the well legholder and both legs were placed so she was lying supine on the fracture table without any traction on her legs. She was prepped and draped in a sterile fashion over the right arm. A FluoroScan was used to examine the patient's initial fracture. A closed reduction was performed. During this closed reduction of the distal radius, however, the patient sustained a transverse skin tear of approximately 2 cm. Two smooth 2.0 K wires were then advanced, 1 through the radial styloid, across the fracture and into the metaphysis of the radius and the second 1 from the diaphysis of the radius across the fracture and into the distal end of the radius. The reduction of the fracture was confirmed on AP and lateral FluoroScan images. Pins were then bent to prevent migration and cut with a wire cutter. The Xeroform was placed around the bases of both pins and a cast was applied to the right arm. The patient had her skin tear repaired with 4-0 nylon in interrupted fashion prior to placement of the cast. The patient was then awoken and  brought to the PACU in stable condition. I was scrubbed and present for the entire case, and all sharp and instrument counts were correct at the conclusion of the case. I spoke with the patient's daughter and son to let them know the case had gone without complication. The patient was stable in the recovery room.     ____________________________ Kathreen DevoidKevin L. Khristen Cheyney, MD klk:JT D: 05/15/2014 18:50:30 ET T: 05/15/2014 19:01:22 ET JOB#: [id number]  cc: Kathreen DevoidKevin L. Jaquil Todt, MD, <Dictator> Kathreen DevoidKEVIN L Paul Torpey MD ELECTRONICALLY SIGNED 05/17/2014 17:45

## 2014-11-29 NOTE — Consult Note (Signed)
PATIENT NAME:  Bethany [name], VICE MR#:  [id number] DATE OF BIRTH:  1916/03/15  DATE OF CONSULTATION:  05/09/2014  REFERRING PHYSICIAN:   CONSULTING PHYSICIAN:  Kathreen Devoid, MD  REASON FOR CONSULTATION: Right intertrochanteric hip fracture and right distal radius fracture.   HISTORY OF PRESENT ILLNESS: Miss Bethany [name] is a 81 year old female who was seen in my office this morning. She presented with her daughter from the patient's nursing facility. The daughter reports that she had a fall on Monday, 05/05/2014 at the nursing facility. The patient has Alzheimer dementia and it was not suspected that the patient had sustained a right hip fracture at that time. The patient was noted to have some swelling of the right wrist. Therefore the daughter made an appointment in my office to follow up for her wrist injury. The daughter states the patient has been noted to be having some lower extremity pain. That is why the daughter requested that we get an x-ray of the hip as well due to her recent fall. She was diagnosed in my office with a 2 part intertrochanteric hip fracture as well as a displaced distal radius fracture. She was admitted to the hospitalist service at [facility] from my office. I am consulted for treatment of her above-noted fractures.   PAST MEDICAL HISTORY: Includes COPD, advanced dementia, previous CVA, hypothyroidism, hypertension, pernicious anemia, lung mass, Raynaud's phenomenon, peripheral neuropathy, B12 deficiency, GERD, polymyalgia rheumatica, history of GI bleed and DVT, status post IVC filter placement, and diabetes.   PAST SURGICAL HISTORY: Includes thyroid surgery, surgery on the cervical spine, left hip hemiarthroplasty for fracture, hysterectomy, abdominal aortogram and embolization of splenic flexure for bleeding as well as an IVC filter placed for DVT.    ALLERGIES: FLAGYL.   HOME MEDICATIONS: Include vitamin D3 1000 international units 2 tablets once a  day, furosemide 10 mg p.o. daily, Symbicort 80/4.5 mcg inhalation 2 puffs daily, senna S 50 mg/8.6 mg 1 tablet b.i.d., potassium 10 mg p.o. daily, paroxetine 30 mg p.o. daily, Namenda 10 mg p.o. b.i.d., levothyroxine 50 mcg p.o. daily, and glipizide 5 mg p.o. daily.    REVIEW OF SYSTEMS: Positive for right wrist and hip pain.   PHYSICAL EXAMINATION: Right lower extremity, the patient has lower extremity edema with ecchymosis particularly in the lower leg. She has palpable pedal pulses. She has spontaneous movement of both feet. The right lower extremity is shortened and externally rotated. Her skin is intact.   Examination of the right wrist shows again the skin is intact. There is dorsal angulation to the distal radius. Her fingers are well-perfused and she has palpable radial pulse. It is difficult to assess sensation given the patient's dementia, but there is spontaneous movement of her fingers.   RADIOLOGY: X-ray films of the right hip in my office including AP and lateral views demonstrated a 2-part intertrochanteric hip fracture with mild displacement. Three films of the wrist revealed a dorsally displaced fracture involving the distal radius with radial deviation of the carpus.   X-ray films including AP of the pelvis and AP and lateral of the femur, as well as an AP and lateral of the leg were performed upon admission the hospital. AP pelvis again confirms an intertrochanteric hip fracture on the right side. The patient has a hemiarthroplasty on the left. There is no evidence of acute fracture of the pelvis, but the patient has evidence of healed old right superior and inferior rami fractures. She has a left hip bipolar implant  in place which is not dislocated. The patient's views of the femur show no evidence of fracture, nor do views of the tibia and fibula.   ASSESSMENT:  1.  Right intratrochanteric hip fracture.  2.  Right displaced distal radius fracture.   PLAN: I have had a long  discussion with the patient's daughter. The patient has advanced dementia and therefore her daughter is her healthcare proxy. She has spoken with her brother and they have agreed to proceed with surgery. They understand that I am recommending an open reduction internal fixation for the right intertrochanteric hip fracture and closed reduction versus percutaneous pinning for the right distal radius fracture. I reviewed the risks and benefits of the surgery with her including infection, bleeding, nerve or blood vessel injury, persistent hip pain or weakness, change in lower extremity rotation, leg length discrepancy, re-fracture and the need for further surgery. Medical risks would include but are not limited to DVT and pulmonary embolism, myocardial infarction, stroke, pneumonia, respiratory failure, and death.   The patient's daughter understood these risks and has signed consent form. She also signed a consent form for blood transfusion if necessary. I reviewed the laboratory studies as well as all of the  radiographic films in preparation for the surgery. The patient is cleared for surgery by the medical service, although she has been deemed high risk given her comorbidities and advanced age. The surgery is scheduled for tomorrow morning.    ____________________________ Kathreen DevoidKevin L. Josephine Wooldridge, MD klk:bu D: 05/09/2014 16:31:22 ET T: 05/09/2014 17:04:20 ET JOB#: [id number]  cc: Kathreen DevoidKevin L. Holt Woolbright, MD, <Dictator> Kathreen DevoidKEVIN L Arshia Spellman MD ELECTRONICALLY SIGNED 05/15/2014 18:52

## 2014-11-29 NOTE — Consult Note (Signed)
Brief Consult Note: Diagnosis: Right distal radius and intertrochanteric hip fracture.   Patient was seen by consultant.   Consult note dictated.   Recommend to proceed with surgery or procedure.   Orders entered.   Discussed with Attending MD.   Comments: Discussed case with Dr. Imogene Burnhen from the hospitalist service.  Patient was admitted from my office earlier this AM after I had diagnosed her with a right intertrochanteric hip fracture and right distal radius fracture.  She is being admitted to the hospitalist service for pre-op clearance.  I have recommended ORIF of the hip fracture and closed vs. percutaneous pin fixation for the right distal radius fracture.  Surgery scheduled for tomorrow AM.  Electronic Signatures: Juanell FairlyKrasinski, Broady Lafoy (MD)  (Signed 02-Oct-15 16:19)  Authored: Brief Consult Note   Last Updated: 02-Oct-15 16:19 by Juanell FairlyKrasinski, Shellby Schlink (MD)

## 2014-12-02 LAB — URINALYSIS, COMPLETE
Bilirubin,UR: NEGATIVE
Blood: NEGATIVE
Glucose,UR: NEGATIVE mg/dL (ref 0–75)
Ketone: NEGATIVE
NITRITE: NEGATIVE
PH: 6 (ref 4.5–8.0)
Protein: NEGATIVE
Specific Gravity: 1.011 (ref 1.003–1.030)
Squamous Epithelial: NONE SEEN

## 2014-12-04 LAB — TSH: Thyroid Stimulating Horm: 2.957 u[IU]/mL

## 2014-12-04 LAB — URINE CULTURE

## 2014-12-08 ENCOUNTER — Encounter: Admission: RE | Admit: 2014-12-08 | Payer: Medicare Other | Source: Ambulatory Visit | Admitting: Internal Medicine

## 2014-12-28 ENCOUNTER — Other Ambulatory Visit
Admission: RE | Admit: 2014-12-28 | Discharge: 2014-12-28 | Disposition: A | Discharge status: Home / Self Care | Payer: Medicare Other | Source: Ambulatory Visit | Attending: Internal Medicine | Admitting: Internal Medicine

## 2014-12-28 DIAGNOSIS — R32 Unspecified urinary incontinence: Secondary | ICD-10-CM | POA: Diagnosis present

## 2014-12-28 LAB — URINALYSIS COMPLETE WITH MICROSCOPIC (ARMC ONLY)
BILIRUBIN URINE: NEGATIVE
Glucose, UA: NEGATIVE mg/dL
HGB URINE DIPSTICK: NEGATIVE
KETONES UR: NEGATIVE mg/dL
NITRITE: NEGATIVE
Protein, ur: NEGATIVE mg/dL
Specific Gravity, Urine: 1.01 (ref 1.005–1.030)
Squamous Epithelial / LPF: NONE SEEN
pH: 6 (ref 5.0–8.0)

## 2014-12-29 LAB — URINE CULTURE

## 2014-12-30 ENCOUNTER — Encounter
Admission: RE | Admit: 2014-12-30 | Discharge: 2014-12-30 | Disposition: A | Discharge status: Home / Self Care | Payer: Medicare Other | Source: Ambulatory Visit | Attending: Internal Medicine | Admitting: Internal Medicine

## 2015-01-07 ENCOUNTER — Encounter
Admission: RE | Admit: 2015-01-07 | Discharge: 2015-01-07 | Disposition: A | Discharge status: Home / Self Care | Payer: Medicare Other | Source: Ambulatory Visit | Attending: Internal Medicine | Admitting: Internal Medicine

## 2015-01-07 DIAGNOSIS — R609 Edema, unspecified: Secondary | ICD-10-CM | POA: Insufficient documentation

## 2015-01-22 DIAGNOSIS — R609 Edema, unspecified: Secondary | ICD-10-CM | POA: Diagnosis present

## 2015-01-22 LAB — BASIC METABOLIC PANEL
ANION GAP: 4 — AB (ref 5–15)
BUN: 23 mg/dL — AB (ref 6–20)
CALCIUM: 9.6 mg/dL (ref 8.9–10.3)
CO2: 28 mmol/L (ref 22–32)
CREATININE: 0.95 mg/dL (ref 0.44–1.00)
Chloride: 108 mmol/L (ref 101–111)
GFR calc Af Amer: 56 mL/min — ABNORMAL LOW (ref >60)
GFR calc non Af Amer: 49 mL/min — ABNORMAL LOW (ref >60)
Glucose, Bld: 164 mg/dL — ABNORMAL HIGH (ref 65–99)
POTASSIUM: 4.3 mmol/L (ref 3.5–5.1)
Sodium: 140 mmol/L (ref 135–145)

## 2015-02-06 ENCOUNTER — Encounter
Admission: RE | Admit: 2015-02-06 | Discharge: 2015-02-06 | Disposition: A | Discharge status: Home / Self Care | Payer: Medicare Other | Source: Ambulatory Visit | Attending: Internal Medicine | Admitting: Internal Medicine

## 2015-02-06 DIAGNOSIS — R410 Disorientation, unspecified: Secondary | ICD-10-CM | POA: Insufficient documentation

## 2015-02-17 DIAGNOSIS — R410 Disorientation, unspecified: Secondary | ICD-10-CM | POA: Diagnosis present

## 2015-02-17 LAB — URINALYSIS COMPLETE WITH MICROSCOPIC (ARMC ONLY)
BILIRUBIN URINE: NEGATIVE
Bacteria, UA: NONE SEEN
Glucose, UA: 50 mg/dL — AB
Hgb urine dipstick: NEGATIVE
KETONES UR: NEGATIVE mg/dL
NITRITE: NEGATIVE
Protein, ur: NEGATIVE mg/dL
Specific Gravity, Urine: 1.011 (ref 1.005–1.030)
Squamous Epithelial / LPF: NONE SEEN
pH: 6 (ref 5.0–8.0)

## 2015-02-19 LAB — URINE CULTURE

## 2015-03-08 DIAGNOSIS — R410 Disorientation, unspecified: Secondary | ICD-10-CM | POA: Diagnosis not present

## 2015-03-08 LAB — URINALYSIS COMPLETE WITH MICROSCOPIC (ARMC ONLY)
BILIRUBIN URINE: NEGATIVE
Glucose, UA: >500 mg/dL — AB
Hgb urine dipstick: NEGATIVE
Ketones, ur: NEGATIVE mg/dL
Nitrite: NEGATIVE
PROTEIN: NEGATIVE mg/dL
Specific Gravity, Urine: 1.007 (ref 1.005–1.030)
Squamous Epithelial / LPF: NONE SEEN
pH: 5 (ref 5.0–8.0)

## 2015-03-09 ENCOUNTER — Encounter
Admission: RE | Admit: 2015-03-09 | Discharge: 2015-03-09 | Disposition: A | Discharge status: Home / Self Care | Payer: Medicare Other | Source: Ambulatory Visit | Attending: Internal Medicine | Admitting: Internal Medicine

## 2015-03-10 LAB — URINE CULTURE: Culture: >=100000

## 2015-03-27 LAB — GLUCOSE, CAPILLARY: GLUCOSE-CAPILLARY: 179 mg/dL — AB (ref 65–99)

## 2015-04-09 ENCOUNTER — Encounter
Admission: RE | Admit: 2015-04-09 | Discharge: 2015-04-09 | Disposition: A | Discharge status: Home / Self Care | Payer: Medicare Other | Source: Ambulatory Visit | Attending: Internal Medicine | Admitting: Internal Medicine

## 2015-04-16 LAB — GLUCOSE, CAPILLARY: GLUCOSE-CAPILLARY: 171 mg/dL — AB (ref 65–99)

## 2015-04-23 LAB — GLUCOSE, CAPILLARY: Glucose-Capillary: 196 mg/dL — ABNORMAL HIGH (ref 65–99)

## 2015-05-01 ENCOUNTER — Other Ambulatory Visit
Admission: RE | Admit: 2015-05-01 | Discharge: 2015-05-01 | Disposition: A | Discharge status: Home / Self Care | Payer: Medicare Other | Source: Ambulatory Visit | Attending: Internal Medicine | Admitting: Internal Medicine

## 2015-05-01 DIAGNOSIS — R8299 Other abnormal findings in urine: Secondary | ICD-10-CM | POA: Insufficient documentation

## 2015-05-01 LAB — URINALYSIS COMPLETE WITH MICROSCOPIC (ARMC ONLY)
Bilirubin Urine: NEGATIVE
Glucose, UA: 150 mg/dL — AB
Hgb urine dipstick: NEGATIVE
Ketones, ur: NEGATIVE mg/dL
NITRITE: NEGATIVE
PROTEIN: 30 mg/dL — AB
Specific Gravity, Urine: 1.014 (ref 1.005–1.030)
pH: 7 (ref 5.0–8.0)

## 2015-05-01 LAB — GLUCOSE, CAPILLARY: Glucose-Capillary: 216 mg/dL — ABNORMAL HIGH (ref 65–99)

## 2015-05-03 LAB — URINE CULTURE

## 2015-05-07 LAB — GLUCOSE, CAPILLARY: GLUCOSE-CAPILLARY: 202 mg/dL — AB (ref 65–99)

## 2015-05-09 ENCOUNTER — Encounter
Admission: RE | Admit: 2015-05-09 | Discharge: 2015-05-09 | Disposition: A | Discharge status: Home / Self Care | Payer: Medicare Other | Source: Ambulatory Visit | Attending: Internal Medicine | Admitting: Internal Medicine

## 2015-05-14 LAB — GLUCOSE, CAPILLARY: Glucose-Capillary: 198 mg/dL — ABNORMAL HIGH (ref 65–99)

## 2015-05-21 LAB — GLUCOSE, CAPILLARY: GLUCOSE-CAPILLARY: 177 mg/dL — AB (ref 65–99)

## 2015-05-28 LAB — GLUCOSE, CAPILLARY: Glucose-Capillary: 165 mg/dL — ABNORMAL HIGH (ref 65–99)

## 2015-06-04 LAB — GLUCOSE, CAPILLARY: Glucose-Capillary: 185 mg/dL — ABNORMAL HIGH (ref 65–99)

## 2015-06-09 ENCOUNTER — Encounter
Admission: RE | Admit: 2015-06-09 | Discharge: 2015-06-09 | Disposition: A | Discharge status: Home / Self Care | Payer: Medicare Other | Source: Ambulatory Visit | Attending: Internal Medicine | Admitting: Internal Medicine

## 2015-06-09 DIAGNOSIS — E1142 Type 2 diabetes mellitus with diabetic polyneuropathy: Secondary | ICD-10-CM | POA: Insufficient documentation

## 2015-06-11 DIAGNOSIS — E1142 Type 2 diabetes mellitus with diabetic polyneuropathy: Secondary | ICD-10-CM | POA: Diagnosis present

## 2015-06-11 LAB — GLUCOSE, CAPILLARY: Glucose-Capillary: 245 mg/dL — ABNORMAL HIGH (ref 65–99)

## 2015-06-12 ENCOUNTER — Other Ambulatory Visit
Admission: RE | Admit: 2015-06-12 | Discharge: 2015-06-12 | Disposition: A | Discharge status: Home / Self Care | Payer: Medicare Other | Source: Skilled Nursing Facility | Attending: Internal Medicine | Admitting: Internal Medicine

## 2015-06-12 DIAGNOSIS — N390 Urinary tract infection, site not specified: Secondary | ICD-10-CM | POA: Diagnosis not present

## 2015-06-12 DIAGNOSIS — R3915 Urgency of urination: Secondary | ICD-10-CM | POA: Diagnosis present

## 2015-06-12 LAB — URINALYSIS COMPLETE WITH MICROSCOPIC (ARMC ONLY)
BILIRUBIN URINE: NEGATIVE
Glucose, UA: 50 mg/dL — AB
Hgb urine dipstick: NEGATIVE
KETONES UR: NEGATIVE mg/dL
NITRITE: NEGATIVE
PH: 6 (ref 5.0–8.0)
PROTEIN: NEGATIVE mg/dL
SPECIFIC GRAVITY, URINE: 1.015 (ref 1.005–1.030)
Squamous Epithelial / LPF: NONE SEEN

## 2015-06-18 DIAGNOSIS — E1142 Type 2 diabetes mellitus with diabetic polyneuropathy: Secondary | ICD-10-CM | POA: Diagnosis not present

## 2015-06-18 LAB — GLUCOSE, CAPILLARY: Glucose-Capillary: 204 mg/dL — ABNORMAL HIGH (ref 65–99)

## 2015-06-18 LAB — URINE CULTURE

## 2015-06-25 DIAGNOSIS — E1142 Type 2 diabetes mellitus with diabetic polyneuropathy: Secondary | ICD-10-CM | POA: Diagnosis not present

## 2015-06-25 LAB — GLUCOSE, CAPILLARY: GLUCOSE-CAPILLARY: 194 mg/dL — AB (ref 65–99)

## 2015-07-02 DIAGNOSIS — E1142 Type 2 diabetes mellitus with diabetic polyneuropathy: Secondary | ICD-10-CM | POA: Diagnosis not present

## 2015-07-02 LAB — GLUCOSE, CAPILLARY: Glucose-Capillary: 178 mg/dL — ABNORMAL HIGH (ref 65–99)

## 2015-07-09 ENCOUNTER — Encounter
Admission: RE | Admit: 2015-07-09 | Discharge: 2015-07-09 | Disposition: A | Discharge status: Home / Self Care | Payer: Medicare Other | Source: Ambulatory Visit | Attending: Internal Medicine | Admitting: Internal Medicine

## 2015-07-09 DIAGNOSIS — D649 Anemia, unspecified: Secondary | ICD-10-CM | POA: Diagnosis not present

## 2015-07-09 DIAGNOSIS — R609 Edema, unspecified: Secondary | ICD-10-CM | POA: Insufficient documentation

## 2015-07-09 DIAGNOSIS — E538 Deficiency of other specified B group vitamins: Secondary | ICD-10-CM | POA: Diagnosis not present

## 2015-07-09 DIAGNOSIS — E119 Type 2 diabetes mellitus without complications: Secondary | ICD-10-CM | POA: Insufficient documentation

## 2015-07-09 DIAGNOSIS — E039 Hypothyroidism, unspecified: Secondary | ICD-10-CM | POA: Diagnosis not present

## 2015-07-10 LAB — GLUCOSE, CAPILLARY: Glucose-Capillary: 166 mg/dL — ABNORMAL HIGH (ref 65–99)

## 2015-07-21 DIAGNOSIS — E119 Type 2 diabetes mellitus without complications: Secondary | ICD-10-CM | POA: Diagnosis not present

## 2015-07-21 LAB — CBC WITH DIFFERENTIAL/PLATELET
BASOS ABS: 0 10*3/uL (ref 0–0.1)
BASOS PCT: 1 %
EOS PCT: 2 %
Eosinophils Absolute: 0.1 10*3/uL (ref 0–0.7)
HCT: 34.9 % — ABNORMAL LOW (ref 35.0–47.0)
Hemoglobin: 11.4 g/dL — ABNORMAL LOW (ref 12.0–16.0)
LYMPHS PCT: 41 %
Lymphs Abs: 3.4 10*3/uL (ref 1.0–3.6)
MCH: 30.6 pg (ref 26.0–34.0)
MCHC: 32.7 g/dL (ref 32.0–36.0)
MCV: 93.5 fL (ref 80.0–100.0)
Monocytes Absolute: 1 10*3/uL — ABNORMAL HIGH (ref 0.2–0.9)
Monocytes Relative: 11 %
NEUTROS ABS: 3.9 10*3/uL (ref 1.4–6.5)
Neutrophils Relative %: 45 %
Platelets: 270 10*3/uL (ref 150–440)
RBC: 3.73 MIL/uL — AB (ref 3.80–5.20)
RDW: 13.3 % (ref 11.5–14.5)
WBC: 8.5 10*3/uL (ref 3.6–11.0)

## 2015-07-21 LAB — BASIC METABOLIC PANEL
Anion gap: 11 (ref 5–15)
BUN: 26 mg/dL — AB (ref 6–20)
CHLORIDE: 102 mmol/L (ref 101–111)
CO2: 29 mmol/L (ref 22–32)
CREATININE: 1.14 mg/dL — AB (ref 0.44–1.00)
Calcium: 9.9 mg/dL (ref 8.9–10.3)
GFR calc Af Amer: 45 mL/min — ABNORMAL LOW (ref >60)
GFR calc non Af Amer: 39 mL/min — ABNORMAL LOW (ref >60)
Glucose, Bld: 218 mg/dL — ABNORMAL HIGH (ref 65–99)
POTASSIUM: 4.1 mmol/L (ref 3.5–5.1)
SODIUM: 142 mmol/L (ref 135–145)

## 2015-07-21 LAB — TSH: TSH: 0.478 u[IU]/mL (ref 0.350–4.500)

## 2015-07-21 LAB — HEMOGLOBIN A1C: HEMOGLOBIN A1C: 8.7 % — AB (ref 4.0–6.0)

## 2015-07-22 LAB — VITAMIN D 25 HYDROXY (VIT D DEFICIENCY, FRACTURES): VIT D 25 HYDROXY: 38.5 ng/mL (ref 30.0–100.0)

## 2015-07-30 DIAGNOSIS — E119 Type 2 diabetes mellitus without complications: Secondary | ICD-10-CM | POA: Diagnosis not present

## 2015-07-31 LAB — GLUCOSE, CAPILLARY: GLUCOSE-CAPILLARY: 203 mg/dL — AB (ref 65–99)

## 2015-08-06 DIAGNOSIS — E119 Type 2 diabetes mellitus without complications: Secondary | ICD-10-CM | POA: Diagnosis not present

## 2015-08-06 LAB — GLUCOSE, CAPILLARY: Glucose-Capillary: 182 mg/dL — ABNORMAL HIGH (ref 65–99)

## 2015-08-10 ENCOUNTER — Encounter
Admission: RE | Admit: 2015-08-10 | Discharge: 2015-08-10 | Disposition: A | Discharge status: Home / Self Care | Payer: Medicare Other | Source: Ambulatory Visit | Attending: Internal Medicine | Admitting: Internal Medicine

## 2015-08-10 DIAGNOSIS — E119 Type 2 diabetes mellitus without complications: Secondary | ICD-10-CM | POA: Insufficient documentation

## 2015-08-20 LAB — GLUCOSE, CAPILLARY: GLUCOSE-CAPILLARY: 209 mg/dL — AB (ref 65–99)

## 2015-08-28 LAB — GLUCOSE, CAPILLARY: Glucose-Capillary: 183 mg/dL — ABNORMAL HIGH (ref 65–99)

## 2015-09-03 LAB — GLUCOSE, CAPILLARY: GLUCOSE-CAPILLARY: 174 mg/dL — AB (ref 65–99)

## 2015-09-09 ENCOUNTER — Encounter
Admission: RE | Admit: 2015-09-09 | Discharge: 2015-09-09 | Disposition: A | Discharge status: Home / Self Care | Payer: Medicare Other | Source: Ambulatory Visit | Attending: Internal Medicine | Admitting: Internal Medicine

## 2015-09-09 DIAGNOSIS — R8299 Other abnormal findings in urine: Secondary | ICD-10-CM | POA: Insufficient documentation

## 2015-09-09 DIAGNOSIS — E1142 Type 2 diabetes mellitus with diabetic polyneuropathy: Secondary | ICD-10-CM | POA: Insufficient documentation

## 2015-10-01 DIAGNOSIS — R8299 Other abnormal findings in urine: Secondary | ICD-10-CM | POA: Diagnosis present

## 2015-10-01 DIAGNOSIS — E1142 Type 2 diabetes mellitus with diabetic polyneuropathy: Secondary | ICD-10-CM | POA: Diagnosis not present

## 2015-10-01 LAB — GLUCOSE, CAPILLARY: GLUCOSE-CAPILLARY: 255 mg/dL — AB (ref 65–99)

## 2015-10-04 DIAGNOSIS — E1142 Type 2 diabetes mellitus with diabetic polyneuropathy: Secondary | ICD-10-CM | POA: Diagnosis not present

## 2015-10-04 LAB — URINALYSIS COMPLETE WITH MICROSCOPIC (ARMC ONLY)
Bilirubin Urine: NEGATIVE
Glucose, UA: NEGATIVE mg/dL
KETONES UR: NEGATIVE mg/dL
Nitrite: NEGATIVE
PH: 6 (ref 5.0–8.0)
PROTEIN: 30 mg/dL — AB
SPECIFIC GRAVITY, URINE: 1.01 (ref 1.005–1.030)

## 2015-10-06 LAB — URINE CULTURE: Culture: >=100000

## 2015-10-07 ENCOUNTER — Encounter
Admission: RE | Admit: 2015-10-07 | Discharge: 2015-10-07 | Disposition: A | Discharge status: Home / Self Care | Payer: Medicare Other | Source: Ambulatory Visit | Attending: Internal Medicine | Admitting: Internal Medicine

## 2015-10-07 DIAGNOSIS — E1142 Type 2 diabetes mellitus with diabetic polyneuropathy: Secondary | ICD-10-CM | POA: Insufficient documentation

## 2015-10-08 DIAGNOSIS — E1142 Type 2 diabetes mellitus with diabetic polyneuropathy: Secondary | ICD-10-CM | POA: Diagnosis present

## 2015-10-08 LAB — GLUCOSE, CAPILLARY: Glucose-Capillary: 214 mg/dL — ABNORMAL HIGH (ref 65–99)

## 2015-10-29 DIAGNOSIS — E1142 Type 2 diabetes mellitus with diabetic polyneuropathy: Secondary | ICD-10-CM | POA: Diagnosis not present

## 2015-11-02 LAB — GLUCOSE, CAPILLARY: Glucose-Capillary: 307 mg/dL — ABNORMAL HIGH (ref 65–99)

## 2015-11-05 DIAGNOSIS — E1142 Type 2 diabetes mellitus with diabetic polyneuropathy: Secondary | ICD-10-CM | POA: Diagnosis not present

## 2015-11-05 LAB — GLUCOSE, CAPILLARY: GLUCOSE-CAPILLARY: 258 mg/dL — AB (ref 65–99)

## 2015-11-07 ENCOUNTER — Encounter
Admission: RE | Admit: 2015-11-07 | Discharge: 2015-11-07 | Disposition: A | Discharge status: Home / Self Care | Payer: Medicare Other | Source: Ambulatory Visit | Attending: Internal Medicine | Admitting: Internal Medicine

## 2015-11-07 DIAGNOSIS — R509 Fever, unspecified: Secondary | ICD-10-CM | POA: Insufficient documentation

## 2015-11-07 DIAGNOSIS — R0689 Other abnormalities of breathing: Secondary | ICD-10-CM | POA: Insufficient documentation

## 2015-11-07 DIAGNOSIS — E1142 Type 2 diabetes mellitus with diabetic polyneuropathy: Secondary | ICD-10-CM | POA: Insufficient documentation

## 2015-11-09 DIAGNOSIS — R0689 Other abnormalities of breathing: Secondary | ICD-10-CM | POA: Diagnosis not present

## 2015-11-09 DIAGNOSIS — R509 Fever, unspecified: Secondary | ICD-10-CM | POA: Diagnosis not present

## 2015-11-09 DIAGNOSIS — E1142 Type 2 diabetes mellitus with diabetic polyneuropathy: Secondary | ICD-10-CM | POA: Diagnosis present

## 2015-11-09 LAB — URINALYSIS COMPLETE WITH MICROSCOPIC (ARMC ONLY)
BILIRUBIN URINE: NEGATIVE
HGB URINE DIPSTICK: NEGATIVE
NITRITE: NEGATIVE
PH: 5 (ref 5.0–8.0)
Protein, ur: 30 mg/dL — AB
RBC / HPF: NONE SEEN RBC/hpf (ref 0–5)
Specific Gravity, Urine: 1.017 (ref 1.005–1.030)

## 2015-11-09 LAB — COMPREHENSIVE METABOLIC PANEL
ALBUMIN: 3.2 g/dL — AB (ref 3.5–5.0)
ALK PHOS: 138 U/L — AB (ref 38–126)
ALT: 13 U/L — ABNORMAL LOW (ref 14–54)
ANION GAP: 11 (ref 5–15)
AST: 24 U/L (ref 15–41)
BUN: 49 mg/dL — AB (ref 6–20)
CALCIUM: 10.2 mg/dL (ref 8.9–10.3)
CO2: 25 mmol/L (ref 22–32)
Chloride: 104 mmol/L (ref 101–111)
Creatinine, Ser: 1.8 mg/dL — ABNORMAL HIGH (ref 0.44–1.00)
GFR calc Af Amer: 26 mL/min — ABNORMAL LOW (ref >60)
GFR, EST NON AFRICAN AMERICAN: 22 mL/min — AB (ref >60)
GLUCOSE: 558 mg/dL — AB (ref 65–99)
POTASSIUM: 4.8 mmol/L (ref 3.5–5.1)
Sodium: 140 mmol/L (ref 135–145)
TOTAL PROTEIN: 8.3 g/dL — AB (ref 6.5–8.1)
Total Bilirubin: 0.7 mg/dL (ref 0.3–1.2)

## 2015-11-09 LAB — CBC WITH DIFFERENTIAL/PLATELET
BASOS ABS: 0 10*3/uL (ref 0–0.1)
BASOS PCT: 0 %
EOS ABS: 0 10*3/uL (ref 0–0.7)
EOS PCT: 0 %
HCT: 41.4 % (ref 35.0–47.0)
Hemoglobin: 13.4 g/dL (ref 12.0–16.0)
LYMPHS PCT: 14 %
Lymphs Abs: 1.1 10*3/uL (ref 1.0–3.6)
MCH: 31.5 pg (ref 26.0–34.0)
MCHC: 32.4 g/dL (ref 32.0–36.0)
MCV: 97.2 fL (ref 80.0–100.0)
MONO ABS: 0.7 10*3/uL (ref 0.2–0.9)
Monocytes Relative: 9 %
Neutro Abs: 6 10*3/uL (ref 1.4–6.5)
Neutrophils Relative %: 77 %
PLATELETS: 293 10*3/uL (ref 150–440)
RBC: 4.26 MIL/uL (ref 3.80–5.20)
RDW: 13.9 % (ref 11.5–14.5)
WBC: 7.9 10*3/uL (ref 3.6–11.0)

## 2015-11-09 LAB — GLUCOSE, CAPILLARY: GLUCOSE-CAPILLARY: 533 mg/dL — AB (ref 65–99)

## 2015-11-11 LAB — URINE CULTURE: Culture: >=100000 — AB

## 2015-12-07 DEATH — deceased
